# Patient Record
Sex: Male | Born: 1975 | Race: Black or African American | Hispanic: No | Smoking: Never smoker
Health system: Southern US, Community
[De-identification: ages and names within clinical notes are randomized; demographics above are authoritative.]

## PROBLEM LIST (undated history)

## (undated) DIAGNOSIS — N400 Enlarged prostate without lower urinary tract symptoms: Secondary | ICD-10-CM

## (undated) DIAGNOSIS — E785 Hyperlipidemia, unspecified: Secondary | ICD-10-CM

## (undated) DIAGNOSIS — T7840XA Allergy, unspecified, initial encounter: Secondary | ICD-10-CM

## (undated) HISTORY — PX: MOLE REMOVAL: SHX2046

## (undated) HISTORY — DX: Hyperlipidemia, unspecified: E78.5

## (undated) HISTORY — DX: Benign prostatic hyperplasia without lower urinary tract symptoms: N40.0

## (undated) HISTORY — DX: Allergy, unspecified, initial encounter: T78.40XA

---

## 2005-05-05 ENCOUNTER — Encounter: Admission: RE | Admit: 2005-05-05 | Discharge: 2005-05-05 | Payer: Self-pay | Admitting: Family Medicine

## 2013-12-19 ENCOUNTER — Emergency Department (HOSPITAL_COMMUNITY)
Admission: EM | Admit: 2013-12-19 | Discharge: 2013-12-19 | Disposition: A | Discharge status: Home / Self Care | Payer: Managed Care, Other (non HMO) | Attending: Emergency Medicine | Admitting: Emergency Medicine

## 2013-12-19 ENCOUNTER — Emergency Department (HOSPITAL_COMMUNITY): Payer: Managed Care, Other (non HMO)

## 2013-12-19 ENCOUNTER — Encounter (HOSPITAL_COMMUNITY): Payer: Self-pay | Admitting: Emergency Medicine

## 2013-12-19 DIAGNOSIS — Z87448 Personal history of other diseases of urinary system: Secondary | ICD-10-CM | POA: Insufficient documentation

## 2013-12-19 DIAGNOSIS — R109 Unspecified abdominal pain: Secondary | ICD-10-CM | POA: Insufficient documentation

## 2013-12-19 HISTORY — DX: Benign prostatic hyperplasia without lower urinary tract symptoms: N40.0

## 2013-12-19 LAB — COMPREHENSIVE METABOLIC PANEL
AST: 20 U/L (ref 0–37)
Alkaline Phosphatase: 48 U/L (ref 39–117)
Chloride: 100 mEq/L (ref 96–112)
Creatinine, Ser: 1.07 mg/dL (ref 0.50–1.35)
GFR calc Af Amer: >90 mL/min (ref >90)
GFR calc non Af Amer: 86 mL/min — ABNORMAL LOW (ref >90)
Potassium: 4.3 mEq/L (ref 3.5–5.1)
Sodium: 138 mEq/L (ref 135–145)

## 2013-12-19 LAB — CBC WITH DIFFERENTIAL/PLATELET
Basophils Relative: 0 % (ref 0–1)
Eosinophils Absolute: 0.1 10*3/uL (ref 0.0–0.7)
Eosinophils Relative: 1 % (ref 0–5)
Lymphocytes Relative: 32 % (ref 12–46)
MCH: 28.3 pg (ref 26.0–34.0)
MCV: 86.6 fL (ref 78.0–100.0)
Neutrophils Relative %: 59 % (ref 43–77)
Platelets: 175 10*3/uL (ref 150–400)
RBC: 5.09 MIL/uL (ref 4.22–5.81)
RDW: 12.4 % (ref 11.5–15.5)

## 2013-12-19 LAB — URINALYSIS, ROUTINE W REFLEX MICROSCOPIC
Bilirubin Urine: NEGATIVE
Glucose, UA: NEGATIVE mg/dL
Hgb urine dipstick: NEGATIVE
Specific Gravity, Urine: 1.01 (ref 1.005–1.030)
Urobilinogen, UA: 0.2 mg/dL (ref 0.0–1.0)

## 2013-12-19 LAB — LIPASE, BLOOD: Lipase: 40 U/L (ref 11–59)

## 2013-12-19 NOTE — ED Notes (Signed)
Pt has had some R sided flank/side pain. Pt was taking naprosyn for the pain which helped slightly. Denies any SOB, Cough, CP or abd pain. Pain increase with some movement.

## 2013-12-19 NOTE — ED Provider Notes (Signed)
CSN: [id number]     Arrival date & time 12/19/13  1045 History   First MD Initiated Contact with Patient 12/19/13 1107     Chief Complaint  Patient presents with  . Flank Pain    HPI  Charles Gibson is a 37 y.o. male with a PMH of enlarged prostate who presents to the ED for evaluation of flank pain.  History was provided by the patient.  Patient states that he has had flank pain for the past 6 days.  His pain is located on his right flank with some radiation to his middle back and around his RUQ.  His pain is described as a dull aching sensation.  His pain is worse with movement and is constant.  His pain has been unchanged for the past 6 days not improving or worsening.  He has taken antacids and Naprosyn with no relief.  He has never had similar abdominal pain in the past.  He denies any dysuria, hematuria, or difficulty urinating.  He denies any abdominal pain, emesis, nausea, diarrhea, constipation, change in appetite/activity, fevers, or chills.  He denies any injuries or trauma.  He denies any chest pain, SOB, cough, rhinorrhea, congestion, headache, dizziness or lightheadedness.  He does not have an established PCP but goes to an UC near his house for most of his cares.  He also has a urologist.     Past Medical History  Diagnosis Date  . Prostate enlargement    History reviewed. No pertinent past surgical history. History reviewed. No pertinent family history. History  Substance Use Topics  . Smoking status: Never Smoker   . Smokeless tobacco: Never Used  . Alcohol Use: No    Review of Systems  Constitutional: Negative for fever, chills, diaphoresis, activity change, appetite change and fatigue.  HENT: Negative for congestion, rhinorrhea and sore throat.   Respiratory: Negative for cough, shortness of breath and wheezing.   Cardiovascular: Negative for chest pain and leg swelling.  Gastrointestinal: Negative for nausea, vomiting, abdominal pain, diarrhea and constipation.   Genitourinary: Positive for flank pain. Negative for dysuria, frequency, penile swelling, difficulty urinating and penile pain.  Musculoskeletal: Negative for back pain, myalgias and neck pain.  Skin: Negative for wound.  Neurological: Negative for dizziness, syncope, weakness, light-headedness and headaches.    Allergies  Review of patient's allergies indicates no known allergies.  Home Medications  No current outpatient prescriptions on file. BP 125/82  Pulse 69  Temp(Src) 98.5 F (36.9 C) (Oral)  Resp 20  SpO2 99%  Filed Vitals:   12/19/13 1052  BP: 125/82  Pulse: 69  Temp: 98.5 F (36.9 C)  TempSrc: Oral  Resp: 20  SpO2: 99%    Physical Exam  Nursing note and vitals reviewed. Constitutional: He is oriented to person, place, and time. He appears well-developed and well-nourished. No distress.  HENT:  Head: Normocephalic and atraumatic.  Right Ear: External ear normal.  Left Ear: External ear normal.  Nose: Nose normal.  Mouth/Throat: Oropharynx is clear and moist. No oropharyngeal exudate.  Eyes: Conjunctivae are normal. Pupils are equal, round, and reactive to light. Right eye exhibits no discharge. Left eye exhibits no discharge.  Neck: Normal range of motion. Neck supple.  Cardiovascular: Normal rate, regular rhythm and normal heart sounds.  Exam reveals no gallop and no friction rub.   No murmur heard. Dorsalis pedis pulses present and equal bilaterally.  Pulmonary/Chest: Effort normal and breath sounds normal. No respiratory distress. He has no wheezes. He  has no rales. He exhibits no tenderness.  Abdominal: Soft. Bowel sounds are normal. He exhibits no distension and no mass. There is tenderness. There is no rebound and no guarding.  RUQ tenderness to palpation  Musculoskeletal: Normal range of motion. He exhibits tenderness. He exhibits no edema.       Arms: Tenderness to palpation to the right upper flank and right upper back. Pain worse with movement.  No  lumbar tenderness.  Patient able to ambulate without difficulty or ataxia.    Neurological: He is alert and oriented to person, place, and time.  Skin: Skin is warm and dry. He is not diaphoretic.  No rashes, erythema, edema, ecchymosis or lacerations to the back and chest    ED Course  Procedures (including critical care time) Labs Review Labs Reviewed - No data to display Imaging Review No results found.  EKG Interpretation   None      Results for orders placed during the hospital encounter of 12/19/13  URINALYSIS, ROUTINE W REFLEX MICROSCOPIC      Result Value Range   Color, Urine YELLOW  YELLOW   APPearance CLOUDY (*) CLEAR   Specific Gravity, Urine 1.010  1.005 - 1.030   pH 7.5  5.0 - 8.0   Glucose, UA NEGATIVE  NEGATIVE mg/dL   Hgb urine dipstick NEGATIVE  NEGATIVE   Bilirubin Urine NEGATIVE  NEGATIVE   Ketones, ur NEGATIVE  NEGATIVE mg/dL   Protein, ur NEGATIVE  NEGATIVE mg/dL   Urobilinogen, UA 0.2  0.0 - 1.0 mg/dL   Nitrite NEGATIVE  NEGATIVE   Leukocytes, UA NEGATIVE  NEGATIVE  CBC WITH DIFFERENTIAL      Result Value Range   WBC 5.1  4.0 - 10.5 K/uL   RBC 5.09  4.22 - 5.81 MIL/uL   Hemoglobin 14.4  13.0 - 17.0 g/dL   HCT 16.1  09.6 - 04.5 %   MCV 86.6  78.0 - 100.0 fL   MCH 28.3  26.0 - 34.0 pg   MCHC 32.7  30.0 - 36.0 g/dL   RDW 40.9  81.1 - 91.4 %   Platelets 175  150 - 400 K/uL   Neutrophils Relative % 59  43 - 77 %   Neutro Abs 3.0  1.7 - 7.7 K/uL   Lymphocytes Relative 32  12 - 46 %   Lymphs Abs 1.6  0.7 - 4.0 K/uL   Monocytes Relative 7  3 - 12 %   Monocytes Absolute 0.4  0.1 - 1.0 K/uL   Eosinophils Relative 1  0 - 5 %   Eosinophils Absolute 0.1  0.0 - 0.7 K/uL   Basophils Relative 0  0 - 1 %   Basophils Absolute 0.0  0.0 - 0.1 K/uL  LIPASE, BLOOD      Result Value Range   Lipase 40  11 - 59 U/L  COMPREHENSIVE METABOLIC PANEL      Result Value Range   Sodium 138  135 - 145 mEq/L   Potassium 4.3  3.5 - 5.1 mEq/L   Chloride 100  96 - 112  mEq/L   CO2 30  19 - 32 mEq/L   Glucose, Bld 96  70 - 99 mg/dL   BUN 11  6 - 23 mg/dL   Creatinine, Ser 7.82  0.50 - 1.35 mg/dL   Calcium 9.6  8.4 - 95.6 mg/dL   Total Protein 7.9  6.0 - 8.3 g/dL   Albumin 4.4  3.5 - 5.2 g/dL   AST  20  0 - 37 U/L   ALT 20  0 - 53 U/L   Alkaline Phosphatase 48  39 - 117 U/L   Total Bilirubin 0.7  0.3 - 1.2 mg/dL   GFR calc non Af Amer 86 (*) >90 mL/min   GFR calc Af Amer >90  >90 mL/min     US Abdomen Complete (Final result)  Result time: 12/19/13 14:54:08    Final result by Rad Results In Interface (12/19/13 14:54:08)    Narrative:   CLINICAL DATA: Right upper quadrant abdominal pain and tenderness.  EXAM: ULTRASOUND ABDOMEN COMPLETE  COMPARISON: None.  FINDINGS: Gallbladder:  Mild sludge in gallbladder and probable small polyp. No gallstones, wall thickening or pericholecystic fluid.  Common bile duct:  Diameter: 4.4 mm  Liver:  No focal lesion identified. Within normal limits in parenchymal echogenicity.  IVC:  No abnormality visualized.  Pancreas:  Visualized portion unremarkable.  Spleen:  Size and appearance within normal limits.  Right Kidney:  Length: 12.5 cm. Mildly echogenic. 1.5 cm upper pole cyst containing a mildly thickened internal septation. No hydronephrosis.  Left Kidney:  Length: 11.9 cm. Echogenicity within normal limits. No mass or hydronephrosis visualized.  Abdominal aorta:  No aneurysm visualized.  Other findings:  None.  IMPRESSION: 1. Mild sludge in the gallbladder and possible small gallbladder polyp. 2. No gallstones. 3. Mildly echogenic right kidney compatible with mild medical renal disease.   Electronically Signed By: Gordan Payment M.D. On: 12/19/2013 14:54    MDM   Charles Gibson is a 37 y.o. male with a PMH of enlarged prostate who presents to the ED for evaluation of flank pain.  Patient declined pain medications.    Rechecks  3:30 PM = Patient resting comfortably  watching TV.     Etiology of right flank pain unclear.  Patient had RUQ abdominal, flank, and right upper thoracic tenderness to palpation.  Abdominal US showed mild sludge in the GB with a small GB polyp as well as mild medical renal disease.  Unclear if this could cause the patient's abdominal pain.  Labs unremarkable.  Muscular causes of pain also possible.  Patient afebrile and non-toxic.  Pain well controlled without intervention.  Patient instructed to follow-up with his PCP for HIDA scan, urolgist (Dr. Orion Modest), and nephrology.  Return precautions, discharge instructions, and follow-up was discussed with the patient before discharge.     Discharge Medication List as of 12/19/2013  3:38 PM       Final impressions: 1. Right flank pain     Luiz Iron PA-C   This patient was discussed with Dr. Abbe Amsterdam, PA-C 12/21/13 2322

## 2013-12-19 NOTE — ED Notes (Signed)
US at bedside

## 2013-12-29 NOTE — ED Provider Notes (Signed)
Medical screening examination/treatment/procedure(s) were performed by non-physician practitioner and as supervising physician I was immediately available for consultation/collaboration.  EKG Interpretation   None       Temperence Zenor, MD, FACEP   Tineshia Becraft L Thetis Schwimmer, MD 12/29/13 1506 

## 2014-02-16 ENCOUNTER — Telehealth: Payer: Self-pay

## 2014-02-16 NOTE — Telephone Encounter (Signed)
Medication List and allergies:  Reviewed and updated  90 day supply/mail order: na Local prescriptions: [facility]  Immunizations due: declines flu vaccine; unsure of last Tdap  A/P:   Entered  FH, PSH and Personal Hx Flu vaccine--declines Tdap--due History of BPH since his 75's  To Discuss with Provider: Would like to have reviewed his results from ED abdominal US Found an issue with his left kidney

## 2014-02-19 ENCOUNTER — Encounter: Payer: Self-pay | Admitting: Family Medicine

## 2014-02-19 ENCOUNTER — Ambulatory Visit (INDEPENDENT_AMBULATORY_CARE_PROVIDER_SITE_OTHER): Payer: Managed Care, Other (non HMO) | Admitting: Family Medicine

## 2014-02-19 VITALS — BP 104/70 | HR 67 | Temp 98.2°F | Resp 16 | Ht 72.0 in | Wt 183.4 lb

## 2014-02-19 DIAGNOSIS — N281 Cyst of kidney, acquired: Secondary | ICD-10-CM

## 2014-02-19 DIAGNOSIS — Q619 Cystic kidney disease, unspecified: Secondary | ICD-10-CM

## 2014-02-19 DIAGNOSIS — J069 Acute upper respiratory infection, unspecified: Secondary | ICD-10-CM

## 2014-02-19 DIAGNOSIS — R1011 Right upper quadrant pain: Secondary | ICD-10-CM

## 2014-02-19 LAB — HEPATIC FUNCTION PANEL
ALK PHOS: 46 U/L (ref 39–117)
ALT: 19 U/L (ref 0–53)
AST: 19 U/L (ref 0–37)
Albumin: 4.5 g/dL (ref 3.5–5.2)
BILIRUBIN TOTAL: 0.7 mg/dL (ref 0.3–1.2)
Bilirubin, Direct: 0.1 mg/dL (ref 0.0–0.3)
TOTAL PROTEIN: 7.6 g/dL (ref 6.0–8.3)

## 2014-02-19 LAB — CBC WITH DIFFERENTIAL/PLATELET
BASOS PCT: 0.4 % (ref 0.0–3.0)
Basophils Absolute: 0 10*3/uL (ref 0.0–0.1)
EOS ABS: 0.2 10*3/uL (ref 0.0–0.7)
EOS PCT: 2.4 % (ref 0.0–5.0)
HCT: 46.1 % (ref 39.0–52.0)
Hemoglobin: 15 g/dL (ref 13.0–17.0)
Lymphocytes Relative: 20.3 % (ref 12.0–46.0)
Lymphs Abs: 1.3 10*3/uL (ref 0.7–4.0)
MCHC: 32.5 g/dL (ref 30.0–36.0)
MCV: 89.8 fl (ref 78.0–100.0)
MONO ABS: 0.6 10*3/uL (ref 0.1–1.0)
Monocytes Relative: 9.8 % (ref 3.0–12.0)
NEUTROS ABS: 4.3 10*3/uL (ref 1.4–7.7)
NEUTROS PCT: 67.1 % (ref 43.0–77.0)
Platelets: 194 10*3/uL (ref 150.0–400.0)
RBC: 5.14 Mil/uL (ref 4.22–5.81)
RDW: 13.1 % (ref 11.5–14.6)
WBC: 6.4 10*3/uL (ref 4.5–10.5)

## 2014-02-19 LAB — LIPID PANEL
Cholesterol: 202 mg/dL — ABNORMAL HIGH (ref 0–200)
HDL: 44.7 mg/dL (ref >39.00)
Total CHOL/HDL Ratio: 5
Triglycerides: 68 mg/dL (ref 0.0–149.0)
VLDL: 13.6 mg/dL (ref 0.0–40.0)

## 2014-02-19 LAB — BASIC METABOLIC PANEL
BUN: 11 mg/dL (ref 6–23)
CO2: 27 meq/L (ref 19–32)
Calcium: 9.6 mg/dL (ref 8.4–10.5)
Chloride: 104 mEq/L (ref 96–112)
Creatinine, Ser: 0.9 mg/dL (ref 0.4–1.5)
GFR: 120.4 mL/min (ref >60.00)
GLUCOSE: 89 mg/dL (ref 70–99)
Potassium: 4.3 mEq/L (ref 3.5–5.1)
SODIUM: 140 meq/L (ref 135–145)

## 2014-02-19 LAB — LDL CHOLESTEROL, DIRECT: LDL DIRECT: 149 mg/dL

## 2014-02-19 NOTE — Patient Instructions (Signed)
Schedule your complete physical for 6 months We'll notify you of your lab results and make any changes if needed Continue the low fat diet- this will improve the gallbladder symptoms Drink plenty of fluids, Vit C for oncoming cold If chest congestion and cough- Mucinex DM If head congestion- Dayquil/Nyquil Call with any questions or concerns Welcome!  We're glad to have you!

## 2014-02-19 NOTE — Progress Notes (Signed)
Pre visit review using our clinic review tool, if applicable. No additional management support is needed unless otherwise documented below in the visit note. 

## 2014-02-19 NOTE — Progress Notes (Signed)
   Subjective:    Patient ID: Charles Gibson, male    DOB: 09-Oct-1976, 38 y.o.   MRN: [id number]  HPI New to establish.  Previous MD- Cletus Gash (has seen UC in the interim).  Uro- [place]  abd pain- went to ER on 12/23 for RUQ abd pain.  No vomiting at the time.  Had US done showing gallbladder sludge but no obvious stones.  Possible polyp.  Since visit has changed diet- limiting fried food and fat intake.  No 'episodes' like the one in December.  Has had sporadic but much less intense discomfort since ER visit.  No known family hx of hyperlipidemia.  R renal cyst- noted on abd US done Dec 2014.  No family hx of renal disease.  No hx of HTN.  Review of Systems For ROS see HPI     Objective:   Physical Exam  Vitals reviewed. Constitutional: He is oriented to person, place, and time. He appears well-developed and well-nourished. No distress.  HENT:  Head: Normocephalic and atraumatic.  Eyes: Conjunctivae and EOM are normal. Pupils are equal, round, and reactive to light.  Neck: Normal range of motion. Neck supple. No thyromegaly present.  Cardiovascular: Normal rate, regular rhythm, normal heart sounds and intact distal pulses.   No murmur heard. Pulmonary/Chest: Effort normal and breath sounds normal. No respiratory distress.  Abdominal: Soft. Bowel sounds are normal. He exhibits no distension. There is no tenderness. There is no rebound.  Musculoskeletal: He exhibits no edema.  Lymphadenopathy:    He has no cervical adenopathy.  Neurological: He is alert and oriented to person, place, and time. No cranial nerve deficit.  Skin: Skin is warm and dry.  Psychiatric: He has a normal mood and affect. His behavior is normal.          Assessment & Plan:

## 2014-02-20 ENCOUNTER — Encounter: Payer: Self-pay | Admitting: General Practice

## 2014-02-20 NOTE — Assessment & Plan Note (Signed)
New.  No evidence of bacterial infxn.  Pt is in early stages of likely viral illness- 'i think i'm coming down w/ something'.  Reviewed supportive care and red flags that should prompt return.  Pt expressed understanding and is in agreement w/ plan.

## 2014-02-20 NOTE — Assessment & Plan Note (Signed)
New.  Pt dx'd w/ 'gallbladder sludge' on [place].  Has not had pain since changing to low fat diet.  Check lipids to assess for risk factors in stone formation.  Reviewed supportive care and red flags that should prompt return.  Pt expressed understanding and is in agreement w/ plan.

## 2014-02-20 NOTE — Assessment & Plan Note (Signed)
New.  Check BMP to assess Cr.  Will monitor renal fxn and plan on repeat renal US in 1 year to monitor for growth/stability.  Pt expressed understanding and is in agreement w/ plan.

## 2014-08-21 ENCOUNTER — Encounter: Payer: Self-pay | Admitting: Family Medicine

## 2014-08-21 ENCOUNTER — Ambulatory Visit (INDEPENDENT_AMBULATORY_CARE_PROVIDER_SITE_OTHER): Payer: Managed Care, Other (non HMO) | Admitting: Family Medicine

## 2014-08-21 VITALS — BP 118/78 | HR 67 | Temp 98.2°F | Resp 16 | Ht 72.5 in | Wt 186.5 lb

## 2014-08-21 DIAGNOSIS — E785 Hyperlipidemia, unspecified: Secondary | ICD-10-CM

## 2014-08-21 DIAGNOSIS — Z0000 Encounter for general adult medical examination without abnormal findings: Secondary | ICD-10-CM

## 2014-08-21 LAB — BASIC METABOLIC PANEL
BUN: 13 mg/dL (ref 6–23)
CALCIUM: 9.1 mg/dL (ref 8.4–10.5)
CHLORIDE: 103 meq/L (ref 96–112)
CO2: 28 meq/L (ref 19–32)
Creatinine, Ser: 1 mg/dL (ref 0.4–1.5)
GFR: 103.94 mL/min (ref >60.00)
GLUCOSE: 98 mg/dL (ref 70–99)
POTASSIUM: 4 meq/L (ref 3.5–5.1)
Sodium: 138 mEq/L (ref 135–145)

## 2014-08-21 LAB — LIPID PANEL
Cholesterol: 197 mg/dL (ref 0–200)
HDL: 42.5 mg/dL (ref >39.00)
LDL CALC: 131 mg/dL — AB (ref 0–99)
NonHDL: 154.5
Total CHOL/HDL Ratio: 5
Triglycerides: 117 mg/dL (ref 0.0–149.0)
VLDL: 23.4 mg/dL (ref 0.0–40.0)

## 2014-08-21 LAB — TSH: TSH: 1.4 u[IU]/mL (ref 0.35–4.50)

## 2014-08-21 LAB — HEPATIC FUNCTION PANEL
ALT: 18 U/L (ref 0–53)
AST: 21 U/L (ref 0–37)
Albumin: 4.3 g/dL (ref 3.5–5.2)
Alkaline Phosphatase: 39 U/L (ref 39–117)
BILIRUBIN TOTAL: 0.7 mg/dL (ref 0.2–1.2)
Bilirubin, Direct: 0 mg/dL (ref 0.0–0.3)
TOTAL PROTEIN: 7.4 g/dL (ref 6.0–8.3)

## 2014-08-21 LAB — CBC WITH DIFFERENTIAL/PLATELET
Basophils Absolute: 0 10*3/uL (ref 0.0–0.1)
Basophils Relative: 0.4 % (ref 0.0–3.0)
EOS PCT: 1.1 % (ref 0.0–5.0)
Eosinophils Absolute: 0.1 10*3/uL (ref 0.0–0.7)
HCT: 43 % (ref 39.0–52.0)
HEMOGLOBIN: 14.3 g/dL (ref 13.0–17.0)
Lymphocytes Relative: 31.4 % (ref 12.0–46.0)
Lymphs Abs: 1.5 10*3/uL (ref 0.7–4.0)
MCHC: 33.1 g/dL (ref 30.0–36.0)
MCV: 88.7 fl (ref 78.0–100.0)
MONO ABS: 0.4 10*3/uL (ref 0.1–1.0)
Monocytes Relative: 7.8 % (ref 3.0–12.0)
NEUTROS PCT: 59.3 % (ref 43.0–77.0)
Neutro Abs: 2.8 10*3/uL (ref 1.4–7.7)
PLATELETS: 198 10*3/uL (ref 150.0–400.0)
RBC: 4.85 Mil/uL (ref 4.22–5.81)
RDW: 12.4 % (ref 11.5–15.5)
WBC: 4.8 10*3/uL (ref 4.0–10.5)

## 2014-08-21 NOTE — Patient Instructions (Signed)
Schedule your complete physical in 1 year- sooner if you need me! We'll notify you of your lab results and make any changes if needed Keep up the good work on healthy diet and regular exercise Call with any questions or concerns Happy Labor Day!

## 2014-08-21 NOTE — Progress Notes (Signed)
Pre visit review using our clinic review tool, if applicable. No additional management support is needed unless otherwise documented below in the visit note. 

## 2014-08-21 NOTE — Progress Notes (Signed)
   Subjective:    Patient ID: Charles Gibson, male    DOB: February 19, 1976, 38 y.o.   MRN: [id number]  HPI CPE- no concerns.  Uro- seeing Dr Risa Grill regularly for BPH.   Review of Systems Patient reports no vision/hearing changes, anorexia, fever ,adenopathy, persistant/recurrent hoarseness, swallowing issues, chest pain, palpitations, edema, persistant/recurrent cough, hemoptysis, dyspnea (rest,exertional, paroxysmal nocturnal), gastrointestinal  bleeding (melena, rectal bleeding), abdominal pain, excessive heart burn, GU symptoms (dysuria, hematuria, voiding/incontinence issues) syncope, focal weakness, memory loss, numbness & tingling, skin/hair/nail changes, depression, anxiety, abnormal bruising/bleeding, musculoskeletal symptoms/signs.     Objective:   Physical Exam General Appearance:    Alert, cooperative, no distress, appears stated age  Head:    Normocephalic, without obvious abnormality, atraumatic  Eyes:    PERRL, conjunctiva/corneas clear, EOM's intact, fundi    benign, both eyes       Ears:    Normal TM's and external ear canals, both ears  Nose:   Nares normal, septum midline, mucosa normal, no drainage   or sinus tenderness  Throat:   Lips, mucosa, and tongue normal; teeth and gums normal  Neck:   Supple, symmetrical, trachea midline, no adenopathy;       thyroid:  No enlargement/tenderness/nodules  Back:     Symmetric, no curvature, ROM normal, no CVA tenderness  Lungs:     Clear to auscultation bilaterally, respirations unlabored  Chest wall:    No tenderness or deformity  Heart:    Regular rate and rhythm, S1 and S2 normal, no murmur, rub   or gallop  Abdomen:     Soft, non-tender, bowel sounds active all four quadrants,    no masses, no organomegaly  Genitalia:    Deferred to urology  Rectal:    Extremities:   Extremities normal, atraumatic, no cyanosis or edema  Pulses:   2+ and symmetric all extremities  Skin:   Skin color, texture, turgor normal, no rashes or lesions   Lymph nodes:   Cervical, supraclavicular, and axillary nodes normal  Neurologic:   CNII-XII intact. Normal strength, sensation and reflexes      throughout          Assessment & Plan:

## 2014-08-21 NOTE — Assessment & Plan Note (Signed)
Pt's PE WNL.  UTD on urology.  Check labs.  Anticipatory guidance provided.

## 2015-02-05 ENCOUNTER — Ambulatory Visit (INDEPENDENT_AMBULATORY_CARE_PROVIDER_SITE_OTHER): Payer: Managed Care, Other (non HMO) | Admitting: Family Medicine

## 2015-02-05 ENCOUNTER — Encounter: Payer: Self-pay | Admitting: Family Medicine

## 2015-02-05 VITALS — BP 120/80 | HR 78 | Temp 98.0°F | Resp 16 | Wt 187.2 lb

## 2015-02-05 DIAGNOSIS — L505 Cholinergic urticaria: Secondary | ICD-10-CM

## 2015-02-05 MED ORDER — CETIRIZINE HCL 10 MG PO TABS: 10.0000 mg | ORAL_TABLET | Freq: Every day | ORAL | Status: DC

## 2015-02-05 NOTE — Progress Notes (Signed)
   Subjective:    Patient ID: ERYC BODEY, male    DOB: 1976-07-20, 39 y.o.   MRN: [id number]  HPI Skin irritation- pt reports this occurs w/ exercise and elevated heart rate.  sxs started 2-3 yrs ago.  Pt reports diffuse itching but no obvious rash.  'it's like a histamine thing'.  Has not tried OTC antihistamine.  No rash or itching any other times and itching completely resolves by time pt leaves gym.  Denies SOB, facial swelling, difficulty w/ airway.   Review of Systems For ROS see HPI   Reviewed meds, allergies, problem list, and PMH in chart     Objective:   Physical Exam  Constitutional: He is oriented to person, place, and time. He appears well-developed and well-nourished. No distress.  HENT:  Head: Normocephalic and atraumatic.  Cardiovascular: Normal rate, regular rhythm and normal heart sounds.   Pulmonary/Chest: Effort normal and breath sounds normal. No respiratory distress. He has no wheezes. He has no rales.  Musculoskeletal: He exhibits no edema.  Neurological: He is alert and oriented to person, place, and time.  Skin: Skin is warm and dry. No rash noted. No erythema.  Psychiatric: He has a normal mood and affect. His behavior is normal. Thought content normal.  Vitals reviewed.         Assessment & Plan:

## 2015-02-05 NOTE — Assessment & Plan Note (Signed)
Pt's sxs consistent w/ cholinergic urticaria.  No report of airway or facial edema.  Start OTC antihistamine daily for prevention.  Discussed trigger avoidance.  Reviewed supportive care and red flags that should prompt return.  Pt expressed understanding and is in agreement w/ plan.

## 2015-02-05 NOTE — Patient Instructions (Signed)
Follow up as needed This is called cholinergic urticaria and is caused by raising your core temperature Start Zyrtec daily- ask the pharmacy which is cheaper (prescription or OTC) If symptoms continue, please call me so we can increase the dose Call with any questions or concerns Hang in there!

## 2015-02-05 NOTE — Progress Notes (Signed)
Pre visit review using our clinic review tool, if applicable. No additional management support is needed unless otherwise documented below in the visit note. 

## 2015-04-25 ENCOUNTER — Ambulatory Visit (INDEPENDENT_AMBULATORY_CARE_PROVIDER_SITE_OTHER): Payer: Managed Care, Other (non HMO) | Admitting: Family Medicine

## 2015-04-25 ENCOUNTER — Encounter: Payer: Self-pay | Admitting: Family Medicine

## 2015-04-25 VITALS — BP 124/80 | HR 79 | Temp 98.0°F | Resp 16 | Wt 191.0 lb

## 2015-04-25 DIAGNOSIS — M25562 Pain in left knee: Secondary | ICD-10-CM | POA: Diagnosis not present

## 2015-04-25 MED ORDER — MELOXICAM 15 MG PO TABS: 15.0000 mg | ORAL_TABLET | Freq: Every day | ORAL | Status: DC

## 2015-04-25 NOTE — Progress Notes (Signed)
   Subjective:    Patient ID: Charles Gibson, male    DOB: 1976/07/12, 39 y.o.   MRN: [id number]  HPI Knee pain- L knee pain, no known injury or change in activity level.  sxs started ~2 weeks ago.  Pain is a constant 2-3, 'throbbing' will increase to 4-5 w/ activity.  No swelling.  Pt has audible clicking w/ extension.   Review of Systems For ROS see HPI     Objective:   Physical Exam  Constitutional: He is oriented to person, place, and time. He appears well-developed and well-nourished. No distress.  Cardiovascular: Intact distal pulses.   Musculoskeletal: He exhibits tenderness (over distal lateral L quad w/ palpable and audible popping of quad tendon w/ pain). He exhibits no edema.  Full flexion/extension of L knee  Neurological: He is alert and oriented to person, place, and time. He has normal reflexes. No cranial nerve deficit. Coordination normal.  Vitals reviewed.         Assessment & Plan:

## 2015-04-25 NOTE — Assessment & Plan Note (Signed)
New.  No known injury.  No edema.  + popping and clicking of L quad tendon w/ associated pain.  Full ROM of L knee, no edema.  Start daily Mobic and refer to sports medicine for complete evaluation and tx.  Pt expressed understanding and is in agreement w/ plan.

## 2015-04-25 NOTE — Progress Notes (Signed)
Pre visit review using our clinic review tool, if applicable. No additional management support is needed unless otherwise documented below in the visit note. 

## 2015-04-25 NOTE — Patient Instructions (Signed)
Schedule your complete physical for after 8/25 Start the Mobic once daily- take w/ food We'll call you with your sports med appt Call with any questions or concerns Hang in there!

## 2015-05-01 ENCOUNTER — Ambulatory Visit (INDEPENDENT_AMBULATORY_CARE_PROVIDER_SITE_OTHER): Payer: Managed Care, Other (non HMO) | Admitting: Family Medicine

## 2015-05-01 ENCOUNTER — Encounter: Payer: Self-pay | Admitting: Family Medicine

## 2015-05-01 VITALS — BP 127/77 | HR 66 | Ht 71.0 in | Wt 190.0 lb

## 2015-05-01 DIAGNOSIS — M25562 Pain in left knee: Secondary | ICD-10-CM

## 2015-05-01 NOTE — Patient Instructions (Signed)
You have patellofemoral syndrome with quad tendonitis. Avoid painful activities (especially squats and lunges, plyometrics, increasing running mileage) when possible. Straight leg raise, straight leg raise with foot turned outwards, side hip raises 3 sets of 10 once a day. Add ankle weight if these become too easy. Insoles with better arch support (something like dr. Zoe Lan active series for example). Avoid flat shoes, barefoot walking when possible. Icing or heat 15 minutes at a time 3-4 times a day as needed. Tylenol or aleve only as needed for pain If not improving would consider physical therapy. Follow up with me in 6 weeks.

## 2015-05-06 NOTE — Progress Notes (Signed)
PCP and referred by: Annye Asa, MD  Subjective:   HPI: Patient is a 39 y.o. male here for left knee pain.  Patient denies known injury or trauma. He states for especially the past 1-2 months left anterior knee has been bothering him at 2/10 level, up to 5/10 especially with bending. No swelling or bruising. Remotely this bothered him as well but went away. No catching, locking, giving out.  Past Medical History  Diagnosis Date  . Prostate enlargement   . BPH (benign prostatic hyperplasia)     since 20's    Current Outpatient Prescriptions on File Prior to Visit  Medication Sig Dispense Refill  . cetirizine (ZYRTEC) 10 MG tablet Take 1 tablet (10 mg total) by mouth daily. 90 tablet 3  . CIALIS 5 MG tablet Take 5 mg by mouth daily.   6  . meloxicam (MOBIC) 15 MG tablet Take 1 tablet (15 mg total) by mouth daily. 30 tablet 1   No current facility-administered medications on file prior to visit.    Past Surgical History  Procedure Laterality Date  . Mole removal      Allergies  Allergen Reactions  . Adhesive [Tape] Rash    History   Social History  . Marital Status: Married    Spouse Name: N/A  . Number of Children: N/A  . Years of Education: N/A   Occupational History  . Not on file.   Social History Main Topics  . Smoking status: Never Smoker   . Smokeless tobacco: Never Used  . Alcohol Use: No  . Drug Use: No  . Sexual Activity: No   Other Topics Concern  . Not on file   Social History Narrative    Family History  Problem Relation Age of Onset  . Aortic aneurysm Father     BP 127/77 mmHg  Pulse 66  Ht 5\' 11"  (1.803 m)  Wt 190 lb (86.183 kg)  BMI 26.51 kg/m2  Review of Systems: See HPI above.    Objective:  Physical Exam:  Gen: NAD  Left knee: Mild VMO atrophy.  No gross deformity, ecchymoses, swelling. Pes planus. TTP post patellar facets, quad tendon at insertion.  No joint line, other tenderness. FROM. Negative ant/post  drawers. Negative valgus/varus testing. Negative lachmanns. Negative mcmurrays, apleys, patellar apprehension. NV intact distally.    Assessment & Plan:  1. Left knee pain - 2/2 patellofemoral syndrome and mild quad tendonitis.  Shown home exercises to do daily, encouraged better arch supports for overpronation as well.  Icing or heat, tylenol/nsaids if needed.  Consider PT if not improving.  F/u in 6 weeks.

## 2015-05-06 NOTE — Assessment & Plan Note (Signed)
2/2 patellofemoral syndrome and mild quad tendonitis.  Shown home exercises to do daily, encouraged better arch supports for overpronation as well.  Icing or heat, tylenol/nsaids if needed.  Consider PT if not improving.  F/u in 6 weeks.

## 2015-05-31 ENCOUNTER — Encounter: Payer: Self-pay | Admitting: Family Medicine

## 2015-05-31 ENCOUNTER — Ambulatory Visit (INDEPENDENT_AMBULATORY_CARE_PROVIDER_SITE_OTHER): Payer: Managed Care, Other (non HMO) | Admitting: Family Medicine

## 2015-05-31 VITALS — BP 124/82 | HR 79 | Temp 97.9°F | Resp 16 | Wt 194.1 lb

## 2015-05-31 DIAGNOSIS — J3089 Other allergic rhinitis: Secondary | ICD-10-CM

## 2015-05-31 DIAGNOSIS — J309 Allergic rhinitis, unspecified: Secondary | ICD-10-CM | POA: Insufficient documentation

## 2015-05-31 MED ORDER — FLUTICASONE PROPIONATE 50 MCG/ACT NA SUSP: 2.0000 | Freq: Every day | NASAL | Status: DC

## 2015-05-31 NOTE — Patient Instructions (Signed)
Follow up as needed Continue the Zyrtec daily Drink plenty of fluids Add the nasal steroid spray- 2 sprays each nostril Mucinex as needed to thin congestion REST! Call with any questions or concerns- especially if not improving Have a great weekend! Hang in there!

## 2015-05-31 NOTE — Progress Notes (Signed)
   Subjective:    Patient ID: Charles Gibson, male    DOB: 1976/01/23, 39 y.o.   MRN: [id number]  HPI URI- sxs started 2 weeks ago w/ nasal congestion, drainage.  This improved but sxs returned ~5 days ago w/ sinus pressure and congestion.  No tooth pain.  No ear pain.  No fevers.  No HA.  Minimal cough.  No sore throat.  Taking OTC Airborne.   Review of Systems For ROS see HPI     Objective:   Physical Exam  Constitutional: He appears well-developed and well-nourished. No distress.  HENT:  Head: Normocephalic and atraumatic.  No TTP over sinuses + turbinate edema + PND TMs normal bilaterally  Eyes: Conjunctivae and EOM are normal. Pupils are equal, round, and reactive to light.  Neck: Normal range of motion. Neck supple.  Cardiovascular: Normal rate, regular rhythm and normal heart sounds.   Pulmonary/Chest: Effort normal and breath sounds normal. No respiratory distress. He has no wheezes.  Lymphadenopathy:    He has no cervical adenopathy.  Skin: Skin is warm and dry.          Assessment & Plan:

## 2015-05-31 NOTE — Progress Notes (Signed)
Pre visit review using our clinic review tool, if applicable. No additional management support is needed unless otherwise documented below in the visit note. 

## 2015-06-02 NOTE — Assessment & Plan Note (Signed)
New.  Pt's sxs are consistent w/ undertreated seasonal allergies.  No evidence of infxn.  Add nasal steroid to pt's daily OTC antihistamine.  Reviewed supportive care and red flags that should prompt return.  Pt expressed understanding and is in agreement w/ plan.

## 2015-06-12 ENCOUNTER — Ambulatory Visit: Payer: Managed Care, Other (non HMO) | Admitting: Family Medicine

## 2015-10-03 ENCOUNTER — Telehealth: Payer: Self-pay

## 2015-10-03 NOTE — Telephone Encounter (Signed)
Pre Visit call completed. 

## 2015-10-04 ENCOUNTER — Ambulatory Visit (INDEPENDENT_AMBULATORY_CARE_PROVIDER_SITE_OTHER): Payer: Managed Care, Other (non HMO) | Admitting: Family Medicine

## 2015-10-04 ENCOUNTER — Encounter: Payer: Self-pay | Admitting: Family Medicine

## 2015-10-04 VITALS — BP 110/70 | HR 68 | Temp 98.0°F | Resp 16 | Ht 71.0 in | Wt 199.0 lb

## 2015-10-04 DIAGNOSIS — Z0000 Encounter for general adult medical examination without abnormal findings: Secondary | ICD-10-CM | POA: Diagnosis not present

## 2015-10-04 LAB — LIPID PANEL
CHOL/HDL RATIO: 5
Cholesterol: 226 mg/dL — ABNORMAL HIGH (ref 0–200)
HDL: 45.1 mg/dL (ref >39.00)
LDL Cholesterol: 162 mg/dL — ABNORMAL HIGH (ref 0–99)
NONHDL: 180.53
Triglycerides: 91 mg/dL (ref 0.0–149.0)
VLDL: 18.2 mg/dL (ref 0.0–40.0)

## 2015-10-04 LAB — CBC WITH DIFFERENTIAL/PLATELET
BASOS PCT: 0.5 % (ref 0.0–3.0)
Basophils Absolute: 0 10*3/uL (ref 0.0–0.1)
EOS PCT: 0.9 % (ref 0.0–5.0)
Eosinophils Absolute: 0.1 10*3/uL (ref 0.0–0.7)
HEMATOCRIT: 45.9 % (ref 39.0–52.0)
HEMOGLOBIN: 15.4 g/dL (ref 13.0–17.0)
Lymphocytes Relative: 31.1 % (ref 12.0–46.0)
Lymphs Abs: 1.7 10*3/uL (ref 0.7–4.0)
MCHC: 33.5 g/dL (ref 30.0–36.0)
MCV: 87 fl (ref 78.0–100.0)
MONO ABS: 0.5 10*3/uL (ref 0.1–1.0)
MONOS PCT: 9.3 % (ref 3.0–12.0)
Neutro Abs: 3.3 10*3/uL (ref 1.4–7.7)
Neutrophils Relative %: 58.2 % (ref 43.0–77.0)
Platelets: 223 10*3/uL (ref 150.0–400.0)
RBC: 5.28 Mil/uL (ref 4.22–5.81)
RDW: 12.7 % (ref 11.5–15.5)
WBC: 5.6 10*3/uL (ref 4.0–10.5)

## 2015-10-04 LAB — BASIC METABOLIC PANEL
BUN: 13 mg/dL (ref 6–23)
CHLORIDE: 103 meq/L (ref 96–112)
CO2: 29 mEq/L (ref 19–32)
CREATININE: 1.05 mg/dL (ref 0.40–1.50)
Calcium: 9.5 mg/dL (ref 8.4–10.5)
GFR: 99.96 mL/min (ref >60.00)
Glucose, Bld: 98 mg/dL (ref 70–99)
POTASSIUM: 4.1 meq/L (ref 3.5–5.1)
Sodium: 139 mEq/L (ref 135–145)

## 2015-10-04 LAB — HEPATIC FUNCTION PANEL
ALT: 17 U/L (ref 0–53)
AST: 14 U/L (ref 0–37)
Albumin: 4.6 g/dL (ref 3.5–5.2)
Alkaline Phosphatase: 46 U/L (ref 39–117)
BILIRUBIN TOTAL: 0.9 mg/dL (ref 0.2–1.2)
Bilirubin, Direct: 0.1 mg/dL (ref 0.0–0.3)
Total Protein: 7.9 g/dL (ref 6.0–8.3)

## 2015-10-04 LAB — TSH: TSH: 1.88 u[IU]/mL (ref 0.35–4.50)

## 2015-10-04 NOTE — Assessment & Plan Note (Signed)
Pt's PE WNL.  Declines flu shot.  Check labs.  Anticipatory guidance provided.

## 2015-10-04 NOTE — Progress Notes (Signed)
Pre visit review using our clinic review tool, if applicable. No additional management support is needed unless otherwise documented below in the visit note. 

## 2015-10-04 NOTE — Progress Notes (Signed)
   Subjective:    Patient ID: DOMINICK MORELLA, male    DOB: 1976/04/02, 39 y.o.   MRN: [id number]  HPI CPE- no concerns today.  Declines flu shot.  Pt lost twin girls in August when they were born prematurely.  Seeing a Social worker.   Review of Systems Patient reports no vision/hearing changes, anorexia, fever ,adenopathy, persistant/recurrent hoarseness, swallowing issues, chest pain, palpitations, edema, persistant/recurrent cough, hemoptysis, dyspnea (rest,exertional, paroxysmal nocturnal), gastrointestinal  bleeding (melena, rectal bleeding), abdominal pain, excessive heart burn, GU symptoms (dysuria, hematuria, voiding/incontinence issues) syncope, focal weakness, memory loss, numbness & tingling, skin/hair/nail changes, depression, anxiety, abnormal bruising/bleeding, musculoskeletal symptoms/signs.     Objective:   Physical Exam General Appearance:    Alert, cooperative, no distress, appears stated age  Head:    Normocephalic, without obvious abnormality, atraumatic  Eyes:    PERRL, conjunctiva/corneas clear, EOM's intact, fundi    benign, both eyes       Ears:    Normal TM's and external ear canals, both ears  Nose:   Nares normal, septum midline, mucosa normal, no drainage   or sinus tenderness  Throat:   Lips, mucosa, and tongue normal; teeth and gums normal  Neck:   Supple, symmetrical, trachea midline, no adenopathy;       thyroid:  No enlargement/tenderness/nodules  Back:     Symmetric, no curvature, ROM normal, no CVA tenderness  Lungs:     Clear to auscultation bilaterally, respirations unlabored  Chest wall:    No tenderness or deformity  Heart:    Regular rate and rhythm, S1 and S2 normal, no murmur, rub   or gallop  Abdomen:     Soft, non-tender, bowel sounds active all four quadrants,    no masses, no organomegaly  Genitalia:    Normal male without lesion, masses,discharge or tenderness  Rectal:    Deferred due to young age  Extremities:   Extremities normal,  atraumatic, no cyanosis or edema  Pulses:   2+ and symmetric all extremities  Skin:   Skin color, texture, turgor normal, no rashes or lesions  Lymph nodes:   Cervical, supraclavicular, and axillary nodes normal  Neurologic:   CNII-XII intact. Normal strength, sensation and reflexes      throughout          Assessment & Plan:

## 2015-10-04 NOTE — Patient Instructions (Signed)
Follow up in 1 year or as needed We'll notify you of your lab results and make any changes if needed Keep up the good work on healthy diet and regular exercise- you look great! Call with any questions or concerns If you want to join [place] at the new [place] office, any scheduled appointments will automatically transfer and we will see you at [address] [place] [address] [place], [place], [place] [address]  Hang in there! Happy Fall!!!

## 2015-10-08 ENCOUNTER — Other Ambulatory Visit: Payer: Self-pay | Admitting: General Practice

## 2015-10-08 DIAGNOSIS — E785 Hyperlipidemia, unspecified: Secondary | ICD-10-CM

## 2015-10-08 MED ORDER — ATORVASTATIN CALCIUM 10 MG PO TABS: 10.0000 mg | ORAL_TABLET | Freq: Every day | ORAL | Status: DC

## 2016-02-21 ENCOUNTER — Other Ambulatory Visit: Payer: Self-pay | Admitting: Family Medicine

## 2016-02-21 NOTE — Telephone Encounter (Signed)
Medication filled to pharmacy as requested.   

## 2016-03-19 ENCOUNTER — Ambulatory Visit (INDEPENDENT_AMBULATORY_CARE_PROVIDER_SITE_OTHER): Payer: Managed Care, Other (non HMO) | Admitting: Family Medicine

## 2016-03-19 ENCOUNTER — Encounter: Payer: Self-pay | Admitting: Family Medicine

## 2016-03-19 VITALS — BP 128/80 | HR 114 | Temp 100.1°F | Resp 17 | Wt 192.1 lb

## 2016-03-19 DIAGNOSIS — J111 Influenza due to unidentified influenza virus with other respiratory manifestations: Secondary | ICD-10-CM | POA: Diagnosis not present

## 2016-03-19 DIAGNOSIS — R69 Illness, unspecified: Principal | ICD-10-CM

## 2016-03-19 MED ORDER — OSELTAMIVIR PHOSPHATE 75 MG PO CAPS: 75.0000 mg | ORAL_CAPSULE | Freq: Two times a day (BID) | ORAL | Status: DC

## 2016-03-19 MED ORDER — PROMETHAZINE-DM 6.25-15 MG/5ML PO SYRP: 5.0000 mL | ORAL_SOLUTION | Freq: Four times a day (QID) | ORAL | Status: DC | PRN

## 2016-03-19 NOTE — Progress Notes (Signed)
   Subjective:    Patient ID: Charles Gibson, male    DOB: 26-Jul-1976, 40 y.o.   MRN: [id number]  HPI URI- sxs started w/ sore throat on Monday.  Tuesday developed chills and subjective fever.  Stayed home from work yesterday.  Mild HA- 'occasional throbbing'.  + chest congestion.  Denies nasal congestion.  Intermittent productive cough x24 hrs.  Taking Robitussin DM and tylenol for fever.  No sinus pain/pressure.  No ear pain.  Mild body aches.   Review of Systems For ROS see HPI     Objective:   Physical Exam  Constitutional: He is oriented to person, place, and time. He appears well-developed and well-nourished. No distress.  HENT:  Head: Normocephalic and atraumatic.  Right Ear: Tympanic membrane normal.  Left Ear: Tympanic membrane normal.  Nose: Right sinus exhibits no maxillary sinus tenderness and no frontal sinus tenderness. Left sinus exhibits no maxillary sinus tenderness and no frontal sinus tenderness.  Mouth/Throat: Oropharynx is clear and moist. No oropharyngeal exudate.  Neck: Normal range of motion. Neck supple.  Cardiovascular: Normal rate, regular rhythm and normal heart sounds.   Pulmonary/Chest: Effort normal and breath sounds normal. No respiratory distress. He has no wheezes. He has no rales.  Lymphadenopathy:    He has no cervical adenopathy.  Neurological: He is alert and oriented to person, place, and time.  Skin: Skin is warm. No rash noted.  Vitals reviewed.         Assessment & Plan:

## 2016-03-19 NOTE — Progress Notes (Signed)
Pre visit review using our clinic review tool, if applicable. No additional management support is needed unless otherwise documented below in the visit note. 

## 2016-03-19 NOTE — Patient Instructions (Signed)
Follow up as needed Your flu test is negative but this is still suspicious for flu Start the Tamiflu twice daily Drink plenty of fluids REST! Use the cough syrup for nights/weekends- will cause drowsiness Mucinex DM or Robitussin DM or Delsym for daytime cough Alternate tylenol and ibuprofen every 4 hrs as needed for fever or body aches Call with any questions or concerns Hang in there!!!

## 2016-03-19 NOTE — Assessment & Plan Note (Signed)
New.  Pt's fever, chills, body aches and lack of bacterial illness on PE consistent w/ influenza like illness.  Despite negative flu test will start Tamiflu twice daily and cough meds prn.  Reviewed supportive care and red flags that should prompt return.  Pt expressed understanding and is in agreement w/ plan.

## 2016-03-20 LAB — POCT INFLUENZA A/B
Influenza A, POC: NEGATIVE
Influenza B, POC: NEGATIVE

## 2016-03-20 NOTE — Addendum Note (Signed)
Addended by: Davis Gourd on: 03/20/2016 07:48 AM   Modules accepted: Orders

## 2016-08-07 ENCOUNTER — Ambulatory Visit (INDEPENDENT_AMBULATORY_CARE_PROVIDER_SITE_OTHER): Payer: Managed Care, Other (non HMO) | Admitting: Family Medicine

## 2016-08-07 ENCOUNTER — Encounter: Payer: Self-pay | Admitting: Family Medicine

## 2016-08-07 VITALS — BP 122/84 | HR 71 | Temp 98.1°F | Resp 17 | Ht 71.0 in | Wt 191.5 lb

## 2016-08-07 DIAGNOSIS — H60543 Acute eczematoid otitis externa, bilateral: Secondary | ICD-10-CM | POA: Diagnosis not present

## 2016-08-07 MED ORDER — TRIAMCINOLONE ACETONIDE 0.1 % EX OINT: 1.0000 "application " | TOPICAL_OINTMENT | Freq: Two times a day (BID) | CUTANEOUS | 1 refills | Status: DC

## 2016-08-07 NOTE — Progress Notes (Signed)
   Subjective:    Patient ID: WARING WOOLARD, male    DOB: 05-23-1976, 40 y.o.   MRN: [id number]  HPI Rash- behind R ear.  First started as dry skin ~1 month ago.  Started moisturizing w/ lotion and developed increased irritation.  Now behind both ears.  Is waking scratching at night.     Review of Systems For ROS see HPI     Objective:   Physical Exam  Constitutional: He is oriented to person, place, and time. He appears well-developed and well-nourished. No distress.  HENT:  Head: Normocephalic and atraumatic.  Right Ear: External ear normal.  Left Ear: External ear normal.  Neurological: He is alert and oriented to person, place, and time.  Skin: Skin is warm and dry. Rash (dry hyperpigmented skin posterior to ears bilaterally consistent w/ eczema) noted.  Psychiatric: He has a normal mood and affect. His behavior is normal. Thought content normal.  Vitals reviewed.         Assessment & Plan:  Eczema- new.  Dry, hyperpigmented skin posterior to ears bilaterally.  Start Triamcinolone prn.  Reviewed supportive care and red flags that should prompt return.  Pt expressed understanding and is in agreement w/ plan.

## 2016-08-07 NOTE — Progress Notes (Signed)
Pre visit review using our clinic review tool, if applicable. No additional management support is needed unless otherwise documented below in the visit note. 

## 2016-08-07 NOTE — Patient Instructions (Signed)
Follow up as scheduled This appears to be eczema Use the Triamcinolone ointment- thin layer, up to twice daily as needed for flares Call with any questions or concerns Enjoy the rest of your summer!!!

## 2016-08-19 ENCOUNTER — Encounter: Payer: Self-pay | Admitting: Family Medicine

## 2016-08-19 ENCOUNTER — Ambulatory Visit (INDEPENDENT_AMBULATORY_CARE_PROVIDER_SITE_OTHER): Payer: Managed Care, Other (non HMO) | Admitting: Family Medicine

## 2016-08-19 DIAGNOSIS — M2550 Pain in unspecified joint: Secondary | ICD-10-CM | POA: Diagnosis not present

## 2016-08-19 MED ORDER — CYCLOBENZAPRINE HCL 10 MG PO TABS: 10.0000 mg | ORAL_TABLET | Freq: Three times a day (TID) | ORAL | 0 refills | Status: DC | PRN

## 2016-08-19 MED ORDER — MELOXICAM 15 MG PO TABS: 15.0000 mg | ORAL_TABLET | Freq: Every day | ORAL | 1 refills | Status: DC

## 2016-08-19 NOTE — Progress Notes (Signed)
Pre visit review using our clinic review tool, if applicable. No additional management support is needed unless otherwise documented below in the visit note. 

## 2016-08-19 NOTE — Patient Instructions (Signed)
Follow up as needed Start the Meloxicam once daily- take w/ food.  This is for inflammation.  Do not take any additional ibuprofen/aleve/motrin/etc but you can add tylenol Use the Flexeril nightly for muscle spasm.  You can use this during the day but it will cause drowsiness Heat the back and neck, ice the knee and ankle!!! If no improvement in the next 7-10 days, please let me know and we'll send you to ortho Hang in there!!!
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
## 2016-08-29 ENCOUNTER — Other Ambulatory Visit: Payer: Self-pay | Admitting: Family Medicine

## 2016-09-24 ENCOUNTER — Encounter: Payer: Self-pay | Admitting: Family Medicine

## 2016-09-24 ENCOUNTER — Ambulatory Visit (INDEPENDENT_AMBULATORY_CARE_PROVIDER_SITE_OTHER): Payer: Managed Care, Other (non HMO) | Admitting: Family Medicine

## 2016-09-24 VITALS — BP 108/80 | HR 80 | Temp 98.1°F | Ht 71.0 in | Wt 190.2 lb

## 2016-09-24 DIAGNOSIS — J020 Streptococcal pharyngitis: Secondary | ICD-10-CM

## 2016-09-24 LAB — POCT RAPID STREP A (OFFICE): RAPID STREP A SCREEN: POSITIVE — AB

## 2016-09-24 MED ORDER — AMOXICILLIN 500 MG PO CAPS: 500.0000 mg | ORAL_CAPSULE | Freq: Three times a day (TID) | ORAL | 0 refills | Status: DC

## 2016-09-24 NOTE — Progress Notes (Signed)
Pre visit review using our clinic review tool, if applicable. No additional management support is needed unless otherwise documented below in the visit note. 

## 2016-09-24 NOTE — Progress Notes (Signed)
SUBJECTIVE:   Charles Gibson is a 40 y.o. male presents to the clinic for:  Chief Complaint  Patient presents with  . Sore Throat    with night sweats,cough(product-green)-sxs on yest    Complains of sore throat for 1 day.  Other associated symptoms: cough, nightsweats.  Denies: sinus congestion, rhinorrhea, itchy watery eyes, ear pain, ear drainage, shortness of breath and fevers Sick Contacts: none known Therapy to date: none  Son is in the NICU and is concerned he may be contagious.  History  Smoking Status  . Never Smoker  Smokeless Tobacco  . Never Used    ROS: Pertinent items are noted in HPI  Patient's medications, allergies, past medical, surgical, social and family histories were reviewed and updated as appropriate.  OBJECTIVE:  BP 108/80 (BP Location: Left Arm, Patient Position: Sitting, Cuff Size: Normal)   Pulse 80   Temp 98.1 F (36.7 C) (Oral)   Ht 5\' 11"  (1.803 m)   Wt 190 lb 3.2 oz (86.3 kg)   SpO2 98%   BMI 26.53 kg/m  General: Awake, alert, appearing stated age Eyes: conjunctivae and sclerae clear Ears: normal TMs bilaterally Nose: no visible exudate Oropharynx: mild erythema. No exudates. MMM Neck: supple, no significant adenopathy Lungs: clear to auscultation, no wheezes, rales or rhonchi, symmetric air entry, normal effort Heart: rate and rhythm regular Skin:reveals no rash Psych: Age appropriate judgment and insight  ASSESSMENT/PLAN:  Strep throat - Plan: POCT rapid strep A, amoxicillin (AMOXIL) 500 MG capsule  Orders as above. Rapid strep is positive. Contagious for the next 24 hours after starting antibiotics. Get a new toothbrush after 24 hours on antibiotics. F/u prn. Pt voiced understanding and agreement to the plan.  [place], DO 09/24/16 4:35 PM

## 2016-09-24 NOTE — Patient Instructions (Addendum)
New toothbrush after 24 hrs on antibiotic. Consider yourself contagious during these first 24 hours as well.  Strep Throat Strep throat is a bacterial infection of the throat. Your health care provider may call the infection tonsillitis or pharyngitis, depending on whether there is swelling in the tonsils or at the back of the throat. Strep throat is most common during the cold months of the year in children who are 42-42 years of age, but it can happen during any season in people of any age. This infection is spread from person to person (contagious) through coughing, sneezing, or close contact. CAUSES Strep throat is caused by the bacteria called Streptococcus pyogenes. RISK FACTORS This condition is more likely to develop in:  People who spend time in crowded places where the infection can spread easily.  People who have close contact with someone who has strep throat. SYMPTOMS Symptoms of this condition include:  Fever or chills.   Redness, swelling, or pain in the tonsils or throat.  Pain or difficulty when swallowing.  White or yellow spots on the tonsils or throat.  Swollen, tender glands in the neck or under the jaw.  Red rash all over the body (rare). DIAGNOSIS This condition is diagnosed by performing a rapid strep test or by taking a swab of your throat (throat culture test). Results from a rapid strep test are usually ready in a few minutes, but throat culture test results are available after one or two days. TREATMENT This condition is treated with antibiotic medicine. HOME CARE INSTRUCTIONS Medicines  Take over-the-counter and prescription medicines only as told by your health care provider.  Take your antibiotic as told by your health care provider. Do not stop taking the antibiotic even if you start to feel better.  Have family members who also have a sore throat or fever tested for strep throat. They may need antibiotics if they have the strep infection. Eating  and Drinking  Do not share food, drinking cups, or personal items that could cause the infection to spread to other people.  If swallowing is difficult, try eating soft foods until your sore throat feels better.  Drink enough fluid to keep your urine clear or pale yellow. General Instructions  Gargle with a salt-water mixture 3-4 times per day or as needed. To make a salt-water mixture, completely dissolve -1 tsp of salt in 1 cup of warm water.  Make sure that all household members wash their hands well.  Get plenty of rest.  Stay home from school or work until you have been taking antibiotics for 24 hours.  Keep all follow-up visits as told by your health care provider. This is important. SEEK MEDICAL CARE IF:  The glands in your neck continue to get bigger.  You develop a rash, cough, or earache.  You cough up a thick liquid that is green, yellow-brown, or bloody.  You have pain or discomfort that does not get better with medicine.  Your problems seem to be getting worse rather than better.  You have a fever. SEEK IMMEDIATE MEDICAL CARE IF:  You have new symptoms, such as vomiting, severe headache, stiff or painful neck, chest pain, or shortness of breath.  You have severe throat pain, drooling, or changes in your voice.  You have swelling of the neck, or the skin on the neck becomes red and tender.  You have signs of dehydration, such as fatigue, dry mouth, and decreased urination.  You become increasingly sleepy, or you cannot wake  up completely.  Your joints become red or painful.   This information is not intended to replace advice given to you by your health care provider. Make sure you discuss any questions you have with your health care provider.   Document Released: 12/11/2000 Document Revised: 09/04/2015 Document Reviewed: 04/08/2015 Elsevier Interactive Patient Education [employer].

## 2016-09-28 ENCOUNTER — Telehealth: Payer: Self-pay | Admitting: Family Medicine

## 2016-09-28 NOTE — Telephone Encounter (Signed)
Please advise 

## 2016-09-28 NOTE — Telephone Encounter (Signed)
Pt came in office requesting work note to be out today. Pt is still having sx of strep throat. Pt has scratchy throat and almost no voice. Please release note to mychart account.

## 2016-09-30 NOTE — Telephone Encounter (Signed)
Letter written and sent to the pt through MyChart.//AB/CMA

## 2016-09-30 NOTE — Telephone Encounter (Signed)
OK to do-

## 2016-10-09 ENCOUNTER — Encounter: Payer: Self-pay | Admitting: Family Medicine

## 2016-10-09 ENCOUNTER — Ambulatory Visit (INDEPENDENT_AMBULATORY_CARE_PROVIDER_SITE_OTHER): Payer: Managed Care, Other (non HMO) | Admitting: Family Medicine

## 2016-10-09 VITALS — BP 124/82 | HR 76 | Temp 97.9°F | Resp 16 | Ht 71.0 in | Wt 188.0 lb

## 2016-10-09 DIAGNOSIS — Z0000 Encounter for general adult medical examination without abnormal findings: Secondary | ICD-10-CM

## 2016-10-09 DIAGNOSIS — Z23 Encounter for immunization: Secondary | ICD-10-CM | POA: Diagnosis not present

## 2016-10-09 LAB — HEPATIC FUNCTION PANEL
ALT: 20 U/L (ref 0–53)
AST: 21 U/L (ref 0–37)
Albumin: 4.7 g/dL (ref 3.5–5.2)
Alkaline Phosphatase: 56 U/L (ref 39–117)
BILIRUBIN TOTAL: 0.6 mg/dL (ref 0.2–1.2)
Bilirubin, Direct: 0.1 mg/dL (ref 0.0–0.3)
TOTAL PROTEIN: 7.5 g/dL (ref 6.0–8.3)

## 2016-10-09 LAB — CBC WITH DIFFERENTIAL/PLATELET
BASOS ABS: 0 10*3/uL (ref 0.0–0.1)
BASOS PCT: 0.6 % (ref 0.0–3.0)
EOS ABS: 0.1 10*3/uL (ref 0.0–0.7)
Eosinophils Relative: 1.1 % (ref 0.0–5.0)
HCT: 45.2 % (ref 39.0–52.0)
HEMOGLOBIN: 14.9 g/dL (ref 13.0–17.0)
Lymphocytes Relative: 25.1 % (ref 12.0–46.0)
Lymphs Abs: 1.7 10*3/uL (ref 0.7–4.0)
MCHC: 33.1 g/dL (ref 30.0–36.0)
MCV: 87.7 fl (ref 78.0–100.0)
MONO ABS: 0.5 10*3/uL (ref 0.1–1.0)
Monocytes Relative: 6.9 % (ref 3.0–12.0)
NEUTROS ABS: 4.6 10*3/uL (ref 1.4–7.7)
Neutrophils Relative %: 66.3 % (ref 43.0–77.0)
PLATELETS: 279 10*3/uL (ref 150.0–400.0)
RBC: 5.16 Mil/uL (ref 4.22–5.81)
RDW: 12.6 % (ref 11.5–15.5)
WBC: 6.9 10*3/uL (ref 4.0–10.5)

## 2016-10-09 LAB — LIPID PANEL
CHOL/HDL RATIO: 5
Cholesterol: 202 mg/dL — ABNORMAL HIGH (ref 0–200)
HDL: 39.5 mg/dL (ref >39.00)
LDL CALC: 143 mg/dL — AB (ref 0–99)
NonHDL: 162.7
TRIGLYCERIDES: 98 mg/dL (ref 0.0–149.0)
VLDL: 19.6 mg/dL (ref 0.0–40.0)

## 2016-10-09 LAB — BASIC METABOLIC PANEL
BUN: 11 mg/dL (ref 6–23)
CHLORIDE: 103 meq/L (ref 96–112)
CO2: 31 mEq/L (ref 19–32)
CREATININE: 0.98 mg/dL (ref 0.40–1.50)
Calcium: 9.6 mg/dL (ref 8.4–10.5)
GFR: 107.71 mL/min (ref >60.00)
Glucose, Bld: 92 mg/dL (ref 70–99)
Potassium: 4.4 mEq/L (ref 3.5–5.1)
Sodium: 141 mEq/L (ref 135–145)

## 2016-10-09 LAB — TSH: TSH: 1.34 u[IU]/mL (ref 0.35–4.50)

## 2016-10-09 NOTE — Progress Notes (Signed)
Pre visit review using our clinic review tool, if applicable. No additional management support is needed unless otherwise documented below in the visit note. 

## 2016-10-09 NOTE — Addendum Note (Signed)
Addended by: Davis Gourd on: 10/09/2016 08:58 AM   Modules accepted: Orders

## 2016-10-09 NOTE — Assessment & Plan Note (Signed)
Pt's PE WNL.  Will update Tdap and give flu shot today.  Check labs.  Anticipatory guidance provided.

## 2016-10-09 NOTE — Patient Instructions (Signed)
Follow up in 1 year or as needed We'll notify you of your lab results and make any changes if needed Continue to work on healthy diet and regular exercise- you look great! Rest when you are able!!!  Babies are tough!!! Call with any questions or concerns CONGRATS!!!!

## 2016-10-09 NOTE — Progress Notes (Signed)
   Subjective:    Patient ID: Charles Gibson, male    DOB: 27-Aug-1976, 40 y.o.   MRN: [id number]  HPI CPE- pt would like to do Flu and Tdap today.  Too young for colonoscopy, prostate exam.   Review of Systems Patient reports no vision/hearing changes, anorexia, fever ,adenopathy, persistant/recurrent hoarseness, swallowing issues, chest pain, palpitations, edema, persistant/recurrent cough, hemoptysis, dyspnea (rest,exertional, paroxysmal nocturnal), gastrointestinal  bleeding (melena, rectal bleeding), abdominal pain, excessive heart burn, GU symptoms (dysuria, hematuria, voiding/incontinence issues) syncope, focal weakness, memory loss, numbness & tingling, skin/hair/nail changes, depression, anxiety, abnormal bruising/bleeding, musculoskeletal symptoms/signs.     Objective:   Physical Exam General Appearance:    Alert, cooperative, no distress, appears stated age  Head:    Normocephalic, without obvious abnormality, atraumatic  Eyes:    PERRL, conjunctiva/corneas clear, EOM's intact, fundi    benign, both eyes       Ears:    Normal TM's and external ear canals, both ears  Nose:   Nares normal, septum midline, mucosa normal, no drainage   or sinus tenderness  Throat:   Lips, mucosa, and tongue normal; teeth and gums normal  Neck:   Supple, symmetrical, trachea midline, no adenopathy;       thyroid:  No enlargement/tenderness/nodules  Back:     Symmetric, no curvature, ROM normal, no CVA tenderness  Lungs:     Clear to auscultation bilaterally, respirations unlabored  Chest wall:    No tenderness or deformity  Heart:    Regular rate and rhythm, S1 and S2 normal, no murmur, rub   or gallop  Abdomen:     Soft, non-tender, bowel sounds active all four quadrants,    no masses, no organomegaly  Genitalia:    Deferred at pt's request  Rectal:    Extremities:   Extremities normal, atraumatic, no cyanosis or edema  Pulses:   2+ and symmetric all extremities  Skin:   Skin color, texture,  turgor normal, no rashes or lesions  Lymph nodes:   Cervical, supraclavicular, and axillary nodes normal  Neurologic:   CNII-XII intact. Normal strength, sensation and reflexes      throughout          Assessment & Plan:

## 2017-02-17 ENCOUNTER — Ambulatory Visit: Payer: Managed Care, Other (non HMO) | Admitting: Family Medicine

## 2017-04-19 ENCOUNTER — Ambulatory Visit (INDEPENDENT_AMBULATORY_CARE_PROVIDER_SITE_OTHER): Payer: Managed Care, Other (non HMO) | Admitting: Family Medicine

## 2017-04-19 ENCOUNTER — Encounter: Payer: Self-pay | Admitting: Family Medicine

## 2017-04-19 VITALS — BP 122/80 | HR 86 | Temp 98.1°F | Resp 16 | Ht 71.0 in | Wt 192.4 lb

## 2017-04-19 DIAGNOSIS — B958 Unspecified staphylococcus as the cause of diseases classified elsewhere: Secondary | ICD-10-CM | POA: Diagnosis not present

## 2017-04-19 NOTE — Progress Notes (Signed)
   Subjective:    Patient ID: Charles Gibson, male    DOB: 1976/06/22, 41 y.o.   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  2 days later had weeping, oozing, yellow crusting.  Went to UC and was started on Doxy and Mupirocin ointment.  sxs are improving but area remains painful.   Review of Systems For ROS see HPI     Objective:   Physical Exam  Constitutional: He is oriented to person, place, and time. He appears well-developed and well-nourished. No distress.  HENT:  Head: Normocephalic and atraumatic.  Neurological: He is alert and oriented to person, place, and time.  Skin: Skin is warm and dry.  Some crusting under nose in nasolabial folds w/o surrounding erythema.  Psychiatric: He has a normal mood and affect. His behavior is normal.  Vitals reviewed.         Assessment & Plan:  Staph infxn- doing well on Doxy and mupirocin.  No medication changes at this time.  Continue Doxy as directed.  Reviewed supportive care and red flags that should prompt return.  Pt expressed understanding and is in agreement w/ plan.

## 2017-04-19 NOTE — Patient Instructions (Signed)
Follow up as needed/scheduled Finish the Doxycycline as directed- take w/ food Use the Ointment at night until the scabbing resolves It looks great!  You are well on your way to healing! Call with any questions or concerns Happy Spring!!!

## 2017-04-19 NOTE — Progress Notes (Signed)
Pre visit review using our clinic review tool, if applicable. No additional management support is needed unless otherwise documented below in the visit note. 

## 2017-05-17 ENCOUNTER — Ambulatory Visit (INDEPENDENT_AMBULATORY_CARE_PROVIDER_SITE_OTHER): Payer: Managed Care, Other (non HMO) | Admitting: Family Medicine

## 2017-05-17 ENCOUNTER — Encounter: Payer: Self-pay | Admitting: Family Medicine

## 2017-05-17 VITALS — BP 120/83 | HR 90 | Temp 98.1°F | Resp 16 | Ht 71.0 in | Wt 193.4 lb

## 2017-05-17 DIAGNOSIS — H00014 Hordeolum externum left upper eyelid: Secondary | ICD-10-CM | POA: Diagnosis not present

## 2017-05-17 NOTE — Patient Instructions (Signed)
Follow up as needed/scheduled This is a stye and should improve w/ time and supportive measures Apply a warm washcloth to the area for 10 minutes at a time multiple times daily You can use a cool compress to help w/ swelling/pain Tylenol/ibuprofen as needed Try and avoid rubbing your eyes If the area becomes more painful or more red- please let me know and we can start antibiotics Call with any questions or concerns Hang in there!!!

## 2017-05-17 NOTE — Progress Notes (Signed)
Pre visit review using our clinic review tool, if applicable. No additional management support is needed unless otherwise documented below in the visit note. 

## 2017-05-17 NOTE — Progress Notes (Signed)
   Subjective:    Patient ID: Charles Gibson, male    DOB: Jul 25, 1976, 41 y.o.   MRN: [id number]  HPI Swollen eye lid- L sided.  Had some discomfort for a few days and then developed redness and swelling of L upper lid yesterday.  Some discomfort- not painful.  No drainage.  No fevers.  No visual changes.   Review of Systems For ROS see HPI     Objective:   Physical Exam  Constitutional: He is oriented to person, place, and time. He appears well-developed and well-nourished. No distress.  HENT:  Head: Normocephalic and atraumatic.  Eyes: Conjunctivae and EOM are normal. Pupils are equal, round, and reactive to light. Right eye exhibits no discharge. Left eye exhibits no discharge.  L upper lid with small, mildly TTP stye in midline w/ overlying redness but no pus or fluctuance  Neurological: He is alert and oriented to person, place, and time.  Skin: Skin is warm and dry.  Psychiatric: He has a normal mood and affect. His behavior is normal. Thought content normal.  Vitals reviewed.         Assessment & Plan:  Stye- new.  No evidence of cellulitis or need for abx at this time.  Reviewed supportive care and red flags that should prompt return.  Pt expressed understanding and is in agreement w/ plan.

## 2017-05-18 ENCOUNTER — Encounter: Payer: Self-pay | Admitting: Family Medicine

## 2017-05-19 ENCOUNTER — Encounter: Payer: Self-pay | Admitting: Family Medicine

## 2017-05-19 MED ORDER — CEPHALEXIN 500 MG PO CAPS: 500.0000 mg | ORAL_CAPSULE | Freq: Two times a day (BID) | ORAL | 0 refills | Status: AC

## 2017-05-31 ENCOUNTER — Encounter: Payer: Self-pay | Admitting: Family Medicine

## 2017-06-09 ENCOUNTER — Encounter: Payer: Self-pay | Admitting: Family Medicine

## 2017-06-09 DIAGNOSIS — H00014 Hordeolum externum left upper eyelid: Secondary | ICD-10-CM

## 2017-06-13 ENCOUNTER — Encounter: Payer: Self-pay | Admitting: Family Medicine

## 2017-10-12 ENCOUNTER — Encounter: Payer: Managed Care, Other (non HMO) | Admitting: Family Medicine

## 2017-12-09 ENCOUNTER — Encounter (HOSPITAL_BASED_OUTPATIENT_CLINIC_OR_DEPARTMENT_OTHER): Payer: Self-pay | Admitting: Emergency Medicine

## 2017-12-09 ENCOUNTER — Other Ambulatory Visit: Payer: Self-pay

## 2017-12-09 ENCOUNTER — Emergency Department (HOSPITAL_BASED_OUTPATIENT_CLINIC_OR_DEPARTMENT_OTHER): Payer: No Typology Code available for payment source

## 2017-12-09 ENCOUNTER — Emergency Department (HOSPITAL_BASED_OUTPATIENT_CLINIC_OR_DEPARTMENT_OTHER)
Admission: EM | Admit: 2017-12-09 | Discharge: 2017-12-09 | Disposition: A | Discharge status: Home / Self Care | Payer: No Typology Code available for payment source | Attending: Emergency Medicine | Admitting: Emergency Medicine

## 2017-12-09 DIAGNOSIS — Z79899 Other long term (current) drug therapy: Secondary | ICD-10-CM | POA: Diagnosis not present

## 2017-12-09 DIAGNOSIS — S161XXA Strain of muscle, fascia and tendon at neck level, initial encounter: Secondary | ICD-10-CM

## 2017-12-09 DIAGNOSIS — Y999 Unspecified external cause status: Secondary | ICD-10-CM | POA: Insufficient documentation

## 2017-12-09 DIAGNOSIS — S39012A Strain of muscle, fascia and tendon of lower back, initial encounter: Secondary | ICD-10-CM

## 2017-12-09 DIAGNOSIS — Y9389 Activity, other specified: Secondary | ICD-10-CM | POA: Diagnosis not present

## 2017-12-09 DIAGNOSIS — Y92410 Unspecified street and highway as the place of occurrence of the external cause: Secondary | ICD-10-CM | POA: Diagnosis not present

## 2017-12-09 DIAGNOSIS — S199XXA Unspecified injury of neck, initial encounter: Secondary | ICD-10-CM | POA: Diagnosis present
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
## 2017-12-09 NOTE — ED Notes (Signed)
ED Provider at bedside. 
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
## 2017-12-09 NOTE — ED Notes (Signed)
Pt on monitor 

## 2018-01-05 ENCOUNTER — Ambulatory Visit (INDEPENDENT_AMBULATORY_CARE_PROVIDER_SITE_OTHER): Payer: Managed Care, Other (non HMO) | Admitting: Family Medicine

## 2018-01-05 ENCOUNTER — Encounter: Payer: Self-pay | Admitting: Family Medicine

## 2018-01-05 ENCOUNTER — Other Ambulatory Visit: Payer: Self-pay

## 2018-01-05 VITALS — BP 122/80 | HR 77 | Temp 98.0°F | Resp 16 | Ht 71.0 in | Wt 198.1 lb

## 2018-01-05 DIAGNOSIS — Z0000 Encounter for general adult medical examination without abnormal findings: Secondary | ICD-10-CM

## 2018-01-05 DIAGNOSIS — E785 Hyperlipidemia, unspecified: Secondary | ICD-10-CM

## 2018-01-05 DIAGNOSIS — Z23 Encounter for immunization: Secondary | ICD-10-CM

## 2018-01-05 MED ORDER — TRIAMCINOLONE ACETONIDE 0.1 % EX OINT: 1.0000 "application " | TOPICAL_OINTMENT | Freq: Two times a day (BID) | CUTANEOUS | 1 refills | Status: DC

## 2018-01-05 NOTE — Assessment & Plan Note (Signed)
Pt's PE WNL.  UTD on Urology- appt upcoming in Feb.  Flu shot today.  Check labs.  Anticipatory guidance provided.

## 2018-01-05 NOTE — Assessment & Plan Note (Signed)
Chronic problem.  Not taking Lipitor.  Check labs and start meds prn.  Pt expressed understanding and is in agreement w/ plan.

## 2018-01-05 NOTE — Patient Instructions (Signed)
Follow up in 6 months to recheck cholesterol We'll notify you of your lab results and make any changes if needed Continue to work on healthy diet and regular exercise- you look great! Call with any questions or concerns Happy New Year!!!

## 2018-01-05 NOTE — Progress Notes (Signed)
   Subjective:    Patient ID: Charles Gibson, male    DOB: 04/20/1976, 42 y.o.   MRN: [id number]  HPI CPE- due for flu shot, UTD on Tdap.  No concerns today.   Review of Systems Patient reports no vision/hearing changes, anorexia, fever ,adenopathy, persistant/recurrent hoarseness, swallowing issues, chest pain, palpitations, edema, persistant/recurrent cough, hemoptysis, dyspnea (rest,exertional, paroxysmal nocturnal), gastrointestinal  bleeding (melena, rectal bleeding), abdominal pain, excessive heart burn, GU symptoms (dysuria, hematuria, voiding/incontinence issues) syncope, focal weakness, memory loss, numbness & tingling, skin/hair/nail changes, depression, anxiety, abnormal bruising/bleeding, musculoskeletal symptoms/signs.     Objective:   Physical Exam General Appearance:    Alert, cooperative, no distress, appears stated age  Head:    Normocephalic, without obvious abnormality, atraumatic  Eyes:    PERRL, conjunctiva/corneas clear, EOM's intact, fundi    benign, both eyes       Ears:    Normal TM's and external ear canals, both ears  Nose:   Nares normal, septum midline, mucosa normal, no drainage   or sinus tenderness  Throat:   Lips, mucosa, and tongue normal; teeth and gums normal  Neck:   Supple, symmetrical, trachea midline, no adenopathy;       thyroid:  No enlargement/tenderness/nodules  Back:     Symmetric, no curvature, ROM normal, no CVA tenderness  Lungs:     Clear to auscultation bilaterally, respirations unlabored  Chest wall:    No tenderness or deformity  Heart:    Regular rate and rhythm, S1 and S2 normal, no murmur, rub   or gallop  Abdomen:     Soft, non-tender, bowel sounds active all four quadrants,    no masses, no organomegaly  Genitalia:    deferred  Rectal:    Extremities:   Extremities normal, atraumatic, no cyanosis or edema  Pulses:   2+ and symmetric all extremities  Skin:   Skin color, texture, turgor normal, no rashes or lesions  Lymph nodes:    Cervical, supraclavicular, and axillary nodes normal  Neurologic:   CNII-XII intact. Normal strength, sensation and reflexes      throughout          Assessment & Plan:

## 2018-01-06 ENCOUNTER — Encounter: Payer: Self-pay | Admitting: General Practice

## 2018-01-06 LAB — HEPATIC FUNCTION PANEL
AG Ratio: 1.5 (calc) (ref 1.0–2.5)
ALT: 15 U/L (ref 9–46)
AST: 17 U/L (ref 10–40)
Albumin: 4.7 g/dL (ref 3.6–5.1)
Alkaline phosphatase (APISO): 49 U/L (ref 40–115)
BILIRUBIN DIRECT: 0.1 mg/dL (ref 0.0–0.2)
BILIRUBIN TOTAL: 0.7 mg/dL (ref 0.2–1.2)
Globulin: 3.1 g/dL (calc) (ref 1.9–3.7)
Indirect Bilirubin: 0.6 mg/dL (calc) (ref 0.2–1.2)
Total Protein: 7.8 g/dL (ref 6.1–8.1)

## 2018-01-06 LAB — CBC WITH DIFFERENTIAL/PLATELET
BASOS ABS: 23 {cells}/uL (ref 0–200)
Basophils Relative: 0.4 %
EOS PCT: 1.1 %
Eosinophils Absolute: 63 cells/uL (ref 15–500)
HEMATOCRIT: 44.8 % (ref 38.5–50.0)
Hemoglobin: 15 g/dL (ref 13.2–17.1)
Lymphs Abs: 1955 cells/uL (ref 850–3900)
MCH: 28.3 pg (ref 27.0–33.0)
MCHC: 33.5 g/dL (ref 32.0–36.0)
MCV: 84.5 fL (ref 80.0–100.0)
MONOS PCT: 8.7 %
MPV: 10 fL (ref 7.5–12.5)
NEUTROS PCT: 55.5 %
Neutro Abs: 3164 cells/uL (ref 1500–7800)
Platelets: 208 10*3/uL (ref 140–400)
RBC: 5.3 10*6/uL (ref 4.20–5.80)
RDW: 12.3 % (ref 11.0–15.0)
Total Lymphocyte: 34.3 %
WBC mixed population: 496 cells/uL (ref 200–950)
WBC: 5.7 10*3/uL (ref 3.8–10.8)

## 2018-01-06 LAB — BASIC METABOLIC PANEL
BUN: 11 mg/dL (ref 7–25)
CALCIUM: 9.8 mg/dL (ref 8.6–10.3)
CHLORIDE: 100 mmol/L (ref 98–110)
CO2: 31 mmol/L (ref 20–32)
Creat: 1.1 mg/dL (ref 0.60–1.35)
Glucose, Bld: 86 mg/dL (ref 65–99)
Potassium: 3.9 mmol/L (ref 3.5–5.3)
SODIUM: 140 mmol/L (ref 135–146)

## 2018-01-06 LAB — LIPID PANEL
CHOLESTEROL: 226 mg/dL — AB (ref <200)
HDL: 40 mg/dL — AB (ref >40)
LDL CHOLESTEROL (CALC): 159 mg/dL — AB
Non-HDL Cholesterol (Calc): 186 mg/dL (calc) — ABNORMAL HIGH (ref <130)
TRIGLYCERIDES: 148 mg/dL (ref <150)
Total CHOL/HDL Ratio: 5.7 (calc) — ABNORMAL HIGH (ref <5.0)

## 2018-01-06 LAB — TSH: TSH: 1.31 mIU/L (ref 0.40–4.50)

## 2018-01-12 ENCOUNTER — Ambulatory Visit: Payer: Managed Care, Other (non HMO) | Admitting: Family Medicine

## 2018-01-12 ENCOUNTER — Other Ambulatory Visit: Payer: Self-pay

## 2018-01-12 ENCOUNTER — Encounter: Payer: Self-pay | Admitting: Family Medicine

## 2018-01-12 VITALS — BP 123/82 | HR 88 | Temp 97.9°F | Resp 16 | Ht 71.0 in | Wt 197.1 lb

## 2018-01-12 DIAGNOSIS — B9689 Other specified bacterial agents as the cause of diseases classified elsewhere: Secondary | ICD-10-CM | POA: Diagnosis not present

## 2018-01-12 DIAGNOSIS — J329 Chronic sinusitis, unspecified: Secondary | ICD-10-CM

## 2018-01-12 MED ORDER — AMOXICILLIN 875 MG PO TABS
875.0000 mg | ORAL_TABLET | Freq: Two times a day (BID) | ORAL | 0 refills | Status: DC
Start: 2018-01-12 — End: 2018-03-08

## 2018-01-12 NOTE — Patient Instructions (Signed)
Follow up as needed or as scheduled Start the Amoxicillin twice daily- take w/ food Drink plenty of fluids REST!!! Mucinex to thin congestion Call with any questions or concerns Hang in there!!!

## 2018-01-12 NOTE — Progress Notes (Signed)
   Subjective:    Patient ID: Charles Gibson, male    DOB: [birth year], 42 y.o.   MRN: [id number]  HPI URI- + sick contacts at home.  No fevers.  + facial pain/pressure, HA, nasal congestion.  sxs started Sunday.  No tooth pain.  Bilateral ear fullness.  No cough or sore throat.   Review of Systems For ROS see HPI     Objective:   Physical Exam  Constitutional: He is oriented to person, place, and time. He appears well-developed and well-nourished. No distress.  HENT:  Head: Normocephalic and atraumatic.  Right Ear: Tympanic membrane normal.  Left Ear: Tympanic membrane normal.  Nose: Mucosal edema and rhinorrhea present. Right sinus exhibits maxillary sinus tenderness and frontal sinus tenderness. Left sinus exhibits maxillary sinus tenderness and frontal sinus tenderness.  Mouth/Throat: Mucous membranes are normal. Oropharyngeal exudate and posterior oropharyngeal erythema present. No posterior oropharyngeal edema.  + PND  Eyes: Conjunctivae and EOM are normal. Pupils are equal, round, and reactive to light.  Neck: Normal range of motion. Neck supple.  Cardiovascular: Normal rate, regular rhythm and normal heart sounds.  Pulmonary/Chest: Effort normal and breath sounds normal. No respiratory distress. He has no wheezes.  Lymphadenopathy:    He has no cervical adenopathy.  Neurological: He is alert and oriented to person, place, and time.  Skin: Skin is warm and dry.  Psychiatric: He has a normal mood and affect. His behavior is normal. Thought content normal.  Vitals reviewed.         Assessment & Plan:  Sinusitis- pt's sxs and PE consistent w/ infxn.  Start abx.  Reviewed supportive care and red flags that should prompt return.  Pt expressed understanding and is in agreement w/ plan.

## 2018-03-08 ENCOUNTER — Encounter: Payer: Self-pay | Admitting: Family Medicine

## 2018-03-08 ENCOUNTER — Other Ambulatory Visit: Payer: Self-pay

## 2018-03-08 ENCOUNTER — Ambulatory Visit: Payer: Managed Care, Other (non HMO) | Admitting: Family Medicine

## 2018-03-08 VITALS — BP 131/80 | HR 85 | Temp 98.0°F | Resp 16 | Ht 71.0 in | Wt 195.2 lb

## 2018-03-08 DIAGNOSIS — J029 Acute pharyngitis, unspecified: Secondary | ICD-10-CM | POA: Diagnosis not present

## 2018-03-08 LAB — POCT RAPID STREP A (OFFICE): Rapid Strep A Screen: NEGATIVE

## 2018-03-08 NOTE — Patient Instructions (Signed)
Your strep test is negative- this is great news! This is appears to be viral and should improve w/ time Drink plenty of fluids REST! Mucinex DM for cough and congestion Alternate tylenol and ibuprofen every 4 hrs as needed for fever or body aches Call with any questions or concerns Hang in there!!!

## 2018-03-08 NOTE — Progress Notes (Signed)
   Subjective:    Patient ID: Charles Gibson, male    DOB: 1976/06/09, 42 y.o.   MRN: [id number]  HPI URI- son recently dx'd w/ pneumonia and conjunctivitis.  Yesterday had chills and temp to 101.  + sore throat, painful to swallow.  Mild nasal congestion.  Took Theraflu last night.  This AM started coughing 'dark mucous'.  No sinus pain/pressure.     Review of Systems For ROS see HPI     Objective:   Physical Exam  Constitutional: He appears well-developed and well-nourished. No distress.  HENT:  Head: Normocephalic and atraumatic.  No TTP over sinuses + turbinate edema + PND TMs normal bilaterally Mild pharyngeal erythema w/o tonsillar enlargement or exudate  Eyes: Conjunctivae and EOM are normal. Pupils are equal, round, and reactive to light.  Neck: Normal range of motion. Neck supple.  Cardiovascular: Normal rate, regular rhythm and normal heart sounds.  Pulmonary/Chest: Effort normal and breath sounds normal. No respiratory distress. He has no wheezes.  Lymphadenopathy:    He has no cervical adenopathy.  Skin: Skin is warm and dry.  Vitals reviewed.         Assessment & Plan:  Sore throat- suspect viral illness but given son's recent illness will do strep test.  Negative.  No need for abx.  Reviewed supportive care and red flags that should prompt return.  Pt expressed understanding and is in agreement w/ plan.

## 2018-03-09 ENCOUNTER — Encounter: Payer: Self-pay | Admitting: Family Medicine

## 2018-03-10 ENCOUNTER — Encounter: Payer: Self-pay | Admitting: General Practice

## 2018-03-21 ENCOUNTER — Encounter: Payer: Self-pay | Admitting: Family Medicine

## 2018-03-21 ENCOUNTER — Other Ambulatory Visit: Payer: Self-pay

## 2018-03-21 ENCOUNTER — Ambulatory Visit: Payer: Managed Care, Other (non HMO) | Admitting: Family Medicine

## 2018-03-21 VITALS — BP 121/80 | HR 84 | Temp 98.2°F | Resp 16 | Ht 71.0 in | Wt 196.4 lb

## 2018-03-21 DIAGNOSIS — H66001 Acute suppurative otitis media without spontaneous rupture of ear drum, right ear: Secondary | ICD-10-CM | POA: Diagnosis not present

## 2018-03-21 MED ORDER — FLUTICASONE PROPIONATE 50 MCG/ACT NA SUSP: 2.0000 | Freq: Every day | NASAL | 6 refills | Status: DC

## 2018-03-21 MED ORDER — AMOXICILLIN 875 MG PO TABS: 875.0000 mg | ORAL_TABLET | Freq: Two times a day (BID) | ORAL | 0 refills | Status: DC

## 2018-03-21 NOTE — Progress Notes (Signed)
   Subjective:    Patient ID: Charles Gibson, male    DOB: 1976-11-03, 42 y.o.   MRN: [id number]  HPI R ear congestion- pt had recent viral illness.  Since last week has had sensation of R ear fullness, 'like I have cotton in my ear'.  No drainage.  No pain.  No fevers.  Taking Zyrtec daily.   Review of Systems For ROS see HPI     Objective:   Physical Exam  Constitutional: He appears well-developed and well-nourished. No distress.  HENT:  Head: Normocephalic and atraumatic.  No TTP over sinuses + turbinate edema + PND R TM dull, visible purulent fluid, bulging  Eyes: Pupils are equal, round, and reactive to light. Conjunctivae and EOM are normal.  Neck: Normal range of motion. Neck supple.  Cardiovascular: Normal rate, regular rhythm and normal heart sounds.  Pulmonary/Chest: Effort normal and breath sounds normal. No respiratory distress. He has no wheezes.  Lymphadenopathy:    He has no cervical adenopathy.  Skin: Skin is warm and dry.  Vitals reviewed.         Assessment & Plan:  R OM- new.  Pt's sxs and PE consistent w/ eustachian tube dysfxn and early OM.  Start abx.  flonase daily.  Reviewed supportive care and red flags that should prompt return.  Pt expressed understanding and is in agreement w/ plan.

## 2018-03-21 NOTE — Patient Instructions (Signed)
Follow up as needed or as scheduled Start the Amoxicillin twice daily- take w/ food Drink plenty of fluids Continue the Zyrtec daily Add the Flonase- 2 sprays each nostril daily- to improve allergy congestion Call with any questions or concerns Hang in there!!!

## 2018-03-28 ENCOUNTER — Encounter: Payer: Self-pay | Admitting: Family Medicine

## 2018-03-30 ENCOUNTER — Encounter: Payer: Self-pay | Admitting: Family Medicine

## 2018-03-30 ENCOUNTER — Other Ambulatory Visit: Payer: Self-pay

## 2018-03-30 ENCOUNTER — Ambulatory Visit: Payer: Managed Care, Other (non HMO) | Admitting: Family Medicine

## 2018-03-30 VITALS — BP 122/80 | HR 84 | Temp 98.1°F | Resp 16 | Ht 71.0 in | Wt 197.5 lb

## 2018-03-30 DIAGNOSIS — J301 Allergic rhinitis due to pollen: Secondary | ICD-10-CM | POA: Diagnosis not present

## 2018-03-30 NOTE — Assessment & Plan Note (Signed)
Ongoing issue.  No evidence of infxn- this has resolved w/ abx.  His sxs are all allergy related.  Reviewed this w/ pt and supportive care.  Pt expressed understanding and is in agreement w/ plan.

## 2018-03-30 NOTE — Progress Notes (Signed)
   Subjective:    Patient ID: RUHAN BORAK, male    DOB: 1976-09-20, 42 y.o.   MRN: [id number]  HPI Ear pain- pt was seen 3/25 and dx'd w/ R OM.  Tx'd w/ Amoxicillin.  Pt reports ear will intermittently stop up.  Now having nasal congestion and some sore throat.  Taking generic for Zyrtec and Flonase.   Review of Systems For ROS see HPI     Objective:   Physical Exam  Constitutional: He appears well-developed and well-nourished. No distress.  HENT:  Head: Normocephalic and atraumatic.  No TTP over sinuses + turbinate edema + PND R TM mildly retracted, L TM WNL  Eyes: Pupils are equal, round, and reactive to light. Conjunctivae and EOM are normal.  Neck: Normal range of motion. Neck supple.  Cardiovascular: Normal rate, regular rhythm and normal heart sounds.  Pulmonary/Chest: Effort normal and breath sounds normal. No respiratory distress. He has no wheezes.  Lymphadenopathy:    He has no cervical adenopathy.  Skin: Skin is warm and dry.  Vitals reviewed.         Assessment & Plan:

## 2018-03-30 NOTE — Patient Instructions (Signed)
Follow up as needed or as scheduled Continue the Zyrtec and the Flonase daily ADD OTC decongestant- Sudafed or Phenylephrine- for a few days (2 or 3) to improve congestion and help with the ear pain Drink plenty of fluids Call with any questions or concerns Hang in there!!!

## 2018-04-04 ENCOUNTER — Ambulatory Visit: Payer: Managed Care, Other (non HMO) | Admitting: Family Medicine

## 2019-01-31 ENCOUNTER — Encounter: Payer: Self-pay | Admitting: Physician Assistant

## 2019-01-31 ENCOUNTER — Other Ambulatory Visit: Payer: Self-pay

## 2019-01-31 ENCOUNTER — Ambulatory Visit: Payer: Managed Care, Other (non HMO) | Admitting: Physician Assistant

## 2019-01-31 VITALS — BP 102/80 | HR 82 | Temp 98.2°F | Resp 14 | Ht 71.0 in | Wt 198.0 lb

## 2019-01-31 DIAGNOSIS — Z20828 Contact with and (suspected) exposure to other viral communicable diseases: Secondary | ICD-10-CM | POA: Diagnosis not present

## 2019-01-31 MED ORDER — OSELTAMIVIR PHOSPHATE 75 MG PO CAPS: 75.0000 mg | ORAL_CAPSULE | Freq: Every day | ORAL | 0 refills | Status: DC

## 2019-01-31 NOTE — Progress Notes (Signed)
Patient presents to clinic today c/o exposure to flu. Notes wife was diagnosed this morning with Flu A and started on Tamiflu. Her symptoms have been severe and started Sunday. Notes he has had some nasal congestion and fatigue since waking this morning. Feels this has gotten slightly worse. Denies fever, chills, aches, sore throat. Has had flu shot this year.    Past Medical History:  Diagnosis Date  . BPH (benign prostatic hyperplasia)    since 20's  . Prostate enlargement     Current Outpatient Medications on File Prior to Visit  Medication Sig Dispense Refill  . cetirizine (ZYRTEC) 10 MG tablet TAKE 1 TABLET BY MOUTH DAILY 90 tablet 0  . CIALIS 5 MG tablet Take 5 mg by mouth daily.   6   No current facility-administered medications on file prior to visit.     Allergies  Allergen Reactions  . Adhesive [Tape] Rash    Family History  Problem Relation Age of Onset  . Aortic aneurysm Father     Social History   Socioeconomic History  . Marital status: Married    Spouse name: Not on file  . Number of children: Not on file  . Years of education: Not on file  . Highest education level: Not on file  Occupational History  . Not on file  Social Needs  . Financial resource strain: Not on file  . Food insecurity:    Worry: Not on file    Inability: Not on file  . Transportation needs:    Medical: Not on file    Non-medical: Not on file  Tobacco Use  . Smoking status: Never Smoker  . Smokeless tobacco: Never Used  Substance and Sexual Activity  . Alcohol use: No    Alcohol/week: 0.0 standard drinks  . Drug use: No  . Sexual activity: Never  Lifestyle  . Physical activity:    Days per week: Not on file    Minutes per session: Not on file  . Stress: Not on file  Relationships  . Social connections:    Talks on phone: Not on file    Gets together: Not on file    Attends religious service: Not on file    Active member of club or organization: Not on file   Attends meetings of clubs or organizations: Not on file    Relationship status: Not on file  Other Topics Concern  . Not on file  Social History Narrative  . Not on file   Review of Systems - See HPI.  All other ROS are negative.  BP 102/80   Pulse 82   Temp 98.2 F (36.8 C) (Oral)   Resp 14   Ht 5\' 11"  (1.803 m)   Wt 198 lb (89.8 kg)   SpO2 96%   BMI 27.62 kg/m   Physical Exam Vitals signs reviewed.  Constitutional:      Appearance: Normal appearance.  HENT:     Head: Normocephalic and atraumatic.     Right Ear: Tympanic membrane normal.     Left Ear: Tympanic membrane normal.     Nose: Nose normal.  Neck:     Musculoskeletal: Neck supple.  Cardiovascular:     Rate and Rhythm: Normal rate and regular rhythm.     Heart sounds: Normal heart sounds.  Pulmonary:     Effort: Pulmonary effort is normal.     Breath sounds: Normal breath sounds.  Neurological:     General: No focal deficit  present.     Mental Status: He is alert and oriented to person, place, and time.    Assessment/Plan: 1. Exposure to the flu Exam unremarkable. Mild nasal congestion since yesterday. Start Tamiflu at preventive dose. Discussed common flu-like symptoms. If these develop within 24 hours he has been instructed to switch to BID dosing of Tamiflu. Supportive measures and OTC medications reviewed.   - oseltamivir (TAMIFLU) 75 MG capsule; Take 1 capsule (75 mg total) by mouth daily.  Dispense: 10 capsule; Refill: 0   Leeanne Rio, PA-C

## 2019-01-31 NOTE — Patient Instructions (Signed)
Please keep well-hydrated and get plenty of rest. Keep hands washed! Lysol is your friend! Start OTC Theraflu for symptom relief if you note increase in symptoms. (See below).  Take the Tamiflu once daily x 10 days as directed (preventive dose). If you note fever, chills, aches or worsening URI symptoms, start taking the Tamiflu as follows: - Take 1 capsule twice daily x 5 days.

## 2019-03-02 ENCOUNTER — Other Ambulatory Visit: Payer: Self-pay

## 2019-03-02 ENCOUNTER — Ambulatory Visit: Payer: Managed Care, Other (non HMO) | Admitting: Family Medicine

## 2019-03-02 ENCOUNTER — Encounter: Payer: Self-pay | Admitting: Family Medicine

## 2019-03-02 VITALS — BP 116/80 | HR 81 | Temp 98.1°F | Resp 16 | Ht 71.0 in | Wt 198.5 lb

## 2019-03-02 DIAGNOSIS — J069 Acute upper respiratory infection, unspecified: Secondary | ICD-10-CM

## 2019-03-02 NOTE — Progress Notes (Signed)
   Subjective:    Patient ID: Charles Gibson, male    DOB: 25-May-1976, 43 y.o.   MRN: [id number]  HPI URI- sxs started Saturday w/ 'scratchy throat'.  + sneezing, congestion, drainage.  Pt reports some bloody drainage when blowing nose.  Pt reports feeling better than this weekend.  No fevers.  Dull headache.  + sick contacts.  No tooth pain.   Review of Systems For ROS see HPI     Objective:   Physical Exam Constitutional:      General: He is not in acute distress.    Appearance: He is well-developed. He is not ill-appearing.  HENT:     Head: Normocephalic and atraumatic.     Right Ear: Tympanic membrane normal.     Left Ear: Tympanic membrane normal.     Nose: No mucosal edema or rhinorrhea.     Right Sinus: No maxillary sinus tenderness or frontal sinus tenderness.     Left Sinus: No maxillary sinus tenderness or frontal sinus tenderness.     Mouth/Throat:     Pharynx: No oropharyngeal exudate or posterior oropharyngeal erythema.  Eyes:     Conjunctiva/sclera: Conjunctivae normal.     Pupils: Pupils are equal, round, and reactive to light.  Neck:     Musculoskeletal: Normal range of motion and neck supple.  Cardiovascular:     Rate and Rhythm: Normal rate and regular rhythm.     Heart sounds: Normal heart sounds.  Pulmonary:     Effort: Pulmonary effort is normal. No respiratory distress.     Breath sounds: Normal breath sounds. No wheezing.     Comments: No cough heard Lymphadenopathy:     Cervical: No cervical adenopathy.  Skin:    General: Skin is warm and dry.  Psychiatric:        Mood and Affect: Mood normal.        Speech: Speech normal.        Behavior: Behavior normal.           Assessment & Plan:

## 2019-03-02 NOTE — Assessment & Plan Note (Signed)
Pt's sxs are consistent w/ resolving URI.  Did discuss possible 2nd sickening w/ a sinus infxn.  No need for abx.  Reviewed supportive care and red flags that should prompt return.  Pt expressed understanding and is in agreement w/ plan.

## 2019-03-02 NOTE — Patient Instructions (Signed)
Follow up as needed or as scheduled Drink plenty of fluids REST! Make sure you are taking your daily allergy medication Add Mucinex DM to thin chest congestion and help w/ cough Call with any questions or concerns Hang in there!!!
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
## 2019-11-21 ENCOUNTER — Encounter: Payer: Self-pay | Admitting: Family Medicine

## 2019-11-21 ENCOUNTER — Ambulatory Visit (INDEPENDENT_AMBULATORY_CARE_PROVIDER_SITE_OTHER): Payer: Managed Care, Other (non HMO) | Admitting: Family Medicine

## 2019-11-21 ENCOUNTER — Other Ambulatory Visit: Payer: Self-pay

## 2019-11-21 DIAGNOSIS — L309 Dermatitis, unspecified: Secondary | ICD-10-CM | POA: Diagnosis not present

## 2019-11-21 MED ORDER — TRIAMCINOLONE ACETONIDE 0.1 % EX OINT: 1.0000 "application " | TOPICAL_OINTMENT | Freq: Two times a day (BID) | CUTANEOUS | 1 refills | Status: AC

## 2019-11-21 NOTE — Progress Notes (Signed)
I have discussed the procedure for the virtual visit with the patient who has given consent to proceed with assessment and treatment.   Pt unable to obtain vitals.   Jessica L Brodmerkel, CMA     

## 2019-11-21 NOTE — Progress Notes (Signed)
   Virtual Visit via Video   I connected with patient on 11/21/19 at 11:00 AM EST by a video enabled telemedicine application and verified that I am speaking with the correct person using two identifiers.  Location patient: Home Location provider: Acupuncturist, Office Persons participating in the virtual visit: Patient, Provider, [place] (Jess B)  I discussed the limitations of evaluation and management by telemedicine and the availability of in person appointments. The patient expressed understanding and agreed to proceed.  Subjective:   HPI:   Dry skin- pt is having dry flaking skin behind ears and along hairline.  Hx of similar.  Previously used Triamcinolone w/ good results.  ROS:   See pertinent positives and negatives per HPI.  Patient Active Problem List   Diagnosis Date Noted  . Influenza-like illness 03/19/2016  . Allergic rhinitis 05/31/2015  . Knee pain, left 04/25/2015  . 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 Routine general medical examination at a health care facility 08/21/2014  . Hyperlipidemia 08/21/2014  . RUQ pain 02/19/2014  . 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 URI (upper respiratory infection) 02/19/2014    Social History   Tobacco Use  . Smoking status: Never Smoker  . Smokeless tobacco: Never Used  Substance Use Topics  . Alcohol use: No    Alcohol/week: 0.0 standard drinks    Current Outpatient Medications:  .  cetirizine (ZYRTEC) 10 MG tablet, TAKE 1 TABLET BY MOUTH DAILY, Disp: 90 tablet, Rfl: 0 .  CIALIS 5 MG tablet, Take 5 mg by mouth daily. , Disp: , Rfl: 6  Allergies  Allergen Reactions  . Adhesive [Tape] Rash    Objective:   There were no vitals taken for this visit.  AAOx3, NAD NCAT, EOMI No obvious CN deficits Coloring WNL Mild flaking of skin along hairline Pt is able to speak clearly, coherently without shortness of breath or increased work of breathing.  Thought process is linear.  Mood is appropriate.   Assessment and  Plan:   Eczema- pt has hx of similar that responded to topical tx w/ triamcinolone.  Refill provided.  Pt to notify if things don't improve.  Pt expressed understanding and is in agreement w/ plan.    Annye Asa, MD 11/21/2019  \

## 2019-12-15 ENCOUNTER — Ambulatory Visit: Payer: Managed Care, Other (non HMO) | Attending: Internal Medicine

## 2019-12-15 DIAGNOSIS — Z20822 Contact with and (suspected) exposure to covid-19: Secondary | ICD-10-CM

## 2019-12-16 LAB — NOVEL CORONAVIRUS, NAA: SARS-CoV-2, NAA: NOT DETECTED

## 2020-02-08 ENCOUNTER — Encounter: Payer: Self-pay | Admitting: Family Medicine

## 2020-02-08 ENCOUNTER — Ambulatory Visit (INDEPENDENT_AMBULATORY_CARE_PROVIDER_SITE_OTHER): Payer: Managed Care, Other (non HMO) | Admitting: Family Medicine

## 2020-02-08 ENCOUNTER — Other Ambulatory Visit: Payer: Self-pay

## 2020-02-08 DIAGNOSIS — J310 Chronic rhinitis: Secondary | ICD-10-CM | POA: Diagnosis not present

## 2020-02-08 NOTE — Progress Notes (Signed)
I have discussed the procedure for the virtual visit with the patient who has given consent to proceed with assessment and treatment.   Pt unable to obtain vitals.   Marieke Lubke L Aristidis Talerico, CMA     

## 2020-02-08 NOTE — Progress Notes (Signed)
   Virtual Visit via Video   I connected with patient on 02/08/20 at  4:00 PM EST by a video enabled telemedicine application and verified that I am speaking with the correct person using two identifiers.  Location patient: Home Location provider: Acupuncturist, Office Persons participating in the virtual visit: Patient, Provider, [place] (Jess B)  I discussed the limitations of evaluation and management by telemedicine and the availability of in person appointments. The patient expressed understanding and agreed to proceed.  Subjective:   HPI:   URI- pt reports L frontal HA starting a few days ago.  Had bloody drainage from L nostril.  No fevers, chills, aches.  No tooth pain.  No ear pain.  + runny nose.  Not currently taking allergy medication.  ROS:   See pertinent positives and negatives per HPI.  Patient Active Problem List   Diagnosis Date Noted  . Influenza-like illness 03/19/2016  . Allergic rhinitis 05/31/2015  . Knee pain, left 04/25/2015  . 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 Routine general medical examination at a health care facility 08/21/2014  . Hyperlipidemia 08/21/2014  . RUQ pain 02/19/2014  . 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 URI (upper respiratory infection) 02/19/2014    Social History   Tobacco Use  . Smoking status: Never Smoker  . Smokeless tobacco: Never Used  Substance Use Topics  . Alcohol use: No    Alcohol/week: 0.0 standard drinks    Current Outpatient Medications:  .  cetirizine (ZYRTEC) 10 MG tablet, TAKE 1 TABLET BY MOUTH DAILY, Disp: 90 tablet, Rfl: 0 .  CIALIS 5 MG tablet, Take 5 mg by mouth daily. , Disp: , Rfl: 6 .  triamcinolone ointment (KENALOG) 0.1 %, Apply 1 application topically 2 (two) times daily., Disp: 80 g, Rfl: 1  Allergies  Allergen Reactions  . Adhesive [Tape] Rash    Objective:   There were no vitals taken for this visit. AAOx3, NAD Well appearing NCAT, EOMI No obvious CN deficits Coloring WNL Pt is  able to speak clearly, coherently without shortness of breath or increased work of breathing.  Thought process is linear.  Mood is appropriate.   Assessment and Plan:   Rhinitis- pt has hx of allergic rhinitis.  Suspect his mild frontal headache is allergy inflammation and his bloody nasal drainage is due to the dry environment.  Encouraged him to restart Zyrtec and use nasal saline to moisten nasal passages.  Reviewed supportive care and red flags that should prompt return.  Pt expressed understanding and is in agreement w/ plan.    Annye Asa, MD 02/08/2020

## 2021-06-25 ENCOUNTER — Encounter: Payer: Self-pay | Admitting: *Deleted

## 2021-10-08 ENCOUNTER — Other Ambulatory Visit: Payer: Self-pay

## 2021-10-08 ENCOUNTER — Ambulatory Visit (INDEPENDENT_AMBULATORY_CARE_PROVIDER_SITE_OTHER): Payer: Managed Care, Other (non HMO) | Admitting: Family Medicine

## 2021-10-08 ENCOUNTER — Encounter: Payer: Self-pay | Admitting: Family Medicine

## 2021-10-08 VITALS — BP 125/80 | HR 62 | Temp 97.9°F | Resp 17 | Ht 72.0 in | Wt 185.0 lb

## 2021-10-08 DIAGNOSIS — Z1211 Encounter for screening for malignant neoplasm of colon: Secondary | ICD-10-CM

## 2021-10-08 DIAGNOSIS — Z0000 Encounter for general adult medical examination without abnormal findings: Secondary | ICD-10-CM | POA: Diagnosis not present

## 2021-10-08 DIAGNOSIS — D2371 Other benign neoplasm of skin of right lower limb, including hip: Secondary | ICD-10-CM | POA: Diagnosis not present

## 2021-10-08 DIAGNOSIS — E785 Hyperlipidemia, unspecified: Secondary | ICD-10-CM

## 2021-10-08 DIAGNOSIS — Z23 Encounter for immunization: Secondary | ICD-10-CM

## 2021-10-08 LAB — CBC WITH DIFFERENTIAL/PLATELET
Basophils Absolute: 0 10*3/uL (ref 0.0–0.1)
Basophils Relative: 0.8 % (ref 0.0–3.0)
Eosinophils Absolute: 0.1 10*3/uL (ref 0.0–0.7)
Eosinophils Relative: 1 % (ref 0.0–5.0)
HCT: 46.1 % (ref 39.0–52.0)
Hemoglobin: 15 g/dL (ref 13.0–17.0)
Lymphocytes Relative: 29.2 % (ref 12.0–46.0)
Lymphs Abs: 1.6 10*3/uL (ref 0.7–4.0)
MCHC: 32.5 g/dL (ref 30.0–36.0)
MCV: 89 fl (ref 78.0–100.0)
Monocytes Absolute: 0.4 10*3/uL (ref 0.1–1.0)
Monocytes Relative: 7.2 % (ref 3.0–12.0)
Neutro Abs: 3.5 10*3/uL (ref 1.4–7.7)
Neutrophils Relative %: 61.8 % (ref 43.0–77.0)
Platelets: 208 10*3/uL (ref 150.0–400.0)
RBC: 5.18 Mil/uL (ref 4.22–5.81)
RDW: 13 % (ref 11.5–15.5)
WBC: 5.6 10*3/uL (ref 4.0–10.5)

## 2021-10-08 LAB — LIPID PANEL
Cholesterol: 227 mg/dL — ABNORMAL HIGH (ref 0–200)
HDL: 52.9 mg/dL (ref >39.00)
LDL Cholesterol: 153 mg/dL — ABNORMAL HIGH (ref 0–99)
NonHDL: 174.28
Total CHOL/HDL Ratio: 4
Triglycerides: 104 mg/dL (ref 0.0–149.0)
VLDL: 20.8 mg/dL (ref 0.0–40.0)

## 2021-10-08 LAB — HEPATIC FUNCTION PANEL
ALT: 14 U/L (ref 0–53)
AST: 18 U/L (ref 0–37)
Albumin: 4.7 g/dL (ref 3.5–5.2)
Alkaline Phosphatase: 50 U/L (ref 39–117)
Bilirubin, Direct: 0.1 mg/dL (ref 0.0–0.3)
Total Bilirubin: 0.6 mg/dL (ref 0.2–1.2)
Total Protein: 7.8 g/dL (ref 6.0–8.3)

## 2021-10-08 LAB — BASIC METABOLIC PANEL
BUN: 12 mg/dL (ref 6–23)
CO2: 31 mEq/L (ref 19–32)
Calcium: 9.9 mg/dL (ref 8.4–10.5)
Chloride: 102 mEq/L (ref 96–112)
Creatinine, Ser: 1.05 mg/dL (ref 0.40–1.50)
GFR: 84.65 mL/min (ref >60.00)
Glucose, Bld: 78 mg/dL (ref 70–99)
Potassium: 4.1 mEq/L (ref 3.5–5.1)
Sodium: 141 mEq/L (ref 135–145)

## 2021-10-08 LAB — TSH: TSH: 1.44 u[IU]/mL (ref 0.35–5.50)

## 2021-10-08 LAB — PSA: PSA: 1.99 ng/mL (ref 0.10–4.00)

## 2021-10-08 NOTE — Assessment & Plan Note (Signed)
Attempting to control w/ diet and exercise.  Check labs.  Determine if medication is needed.

## 2021-10-08 NOTE — Progress Notes (Signed)
   Subjective:    Patient ID: Charles Gibson, male    DOB: Jun 30, 1976, 45 y.o.   MRN: [id number]  HPI CPE- due for colonoscopy.  UTD on Tdap.  Due for flu.  Patient Care Team    Relationship Specialty Notifications Start End  Midge Minium, MD PCP - General Family Medicine  02/19/14   Rana Snare, MD (Inactive) Consulting Physician Urology  08/21/14     Health Maintenance  Topic Date Due   COVID-19 Vaccine (1) Never done   HIV Screening  Never done   Hepatitis C Screening  Never done   COLONOSCOPY (Pts 45-43yrs Insurance coverage will need to be confirmed)  Never done   INFLUENZA VACCINE  07/28/2021   TETANUS/TDAP  10/09/2026   HPV VACCINES  Aged Out      Review of Systems Patient reports no vision/hearing changes, anorexia, fever ,adenopathy, persistant/recurrent hoarseness, swallowing issues, chest pain, palpitations, edema, persistant/recurrent cough, hemoptysis, dyspnea (rest,exertional, paroxysmal nocturnal), gastrointestinal  bleeding (melena, rectal bleeding), abdominal pain, excessive heart burn, GU symptoms (dysuria, hematuria, voiding/incontinence issues) syncope, focal weakness, memory loss, numbness & tingling, skin/hair/nail changes, depression, anxiety, abnormal bruising/bleeding, musculoskeletal symptoms/signs.   This visit occurred during the SARS-CoV-2 public health emergency.  Safety protocols were in place, including screening questions prior to the visit, additional usage of staff PPE, and extensive cleaning of exam room while observing appropriate contact time as indicated for disinfecting solutions.      Objective:   Physical Exam General Appearance:    Alert, cooperative, no distress, appears stated age  Head:    Normocephalic, without obvious abnormality, atraumatic  Eyes:    PERRL, conjunctiva/corneas clear, EOM's intact, fundi    benign, both eyes       Ears:    Normal TM's and external ear canals, both ears  Nose:   Deferred due to COVID  Throat:    Neck:   Supple, symmetrical, trachea midline, no adenopathy;       thyroid:  No enlargement/tenderness/nodules  Back:     Symmetric, no curvature, ROM normal, no CVA tenderness  Lungs:     Clear to auscultation bilaterally, respirations unlabored  Chest wall:    No tenderness or deformity  Heart:    Regular rate and rhythm, S1 and S2 normal, no murmur, rub   or gallop  Abdomen:     Soft, non-tender, bowel sounds active all four quadrants,    no masses, no organomegaly  Genitalia:    Deferred  Rectal:    Extremities:   Extremities normal, atraumatic, no cyanosis or edema  Pulses:   2+ and symmetric all extremities  Skin:   Skin color, texture, turgor normal, no rashes or lesions.  Dermatofibroma on R knee  Lymph nodes:   Cervical, supraclavicular, and axillary nodes normal  Neurologic:   CNII-XII intact. Normal strength, sensation and reflexes      throughout          Assessment & Plan:

## 2021-10-08 NOTE — Patient Instructions (Signed)
Follow up in 1 year or as needed We'll notify you of your lab results and make any changes if needed Continue to work on healthy diet and regular exercise- you're doing great! We'll call you with your GI appt and Dermatology appts Call with any questions or concerns Happy Fall!!

## 2021-10-08 NOTE — Addendum Note (Signed)
Addended by: Genevie Cheshire L on: 10/08/2021 11:18 AM   Modules accepted: Orders

## 2021-10-08 NOTE — Assessment & Plan Note (Signed)
Pt's PE WNL.  UTD on Tdap.  Referral placed for colonoscopy.  Flu shot given today.  Check labs.  Anticipatory guidance provided.

## 2021-10-27 ENCOUNTER — Other Ambulatory Visit: Payer: Self-pay | Admitting: Family Medicine

## 2021-10-29 MED ORDER — CIALIS 5 MG PO TABS: 5.0000 mg | ORAL_TABLET | Freq: Every day | ORAL | 6 refills | Status: DC

## 2021-11-28 ENCOUNTER — Encounter: Payer: Self-pay | Admitting: Family Medicine

## 2021-12-01 MED ORDER — TADALAFIL 5 MG PO TABS: 5.0000 mg | ORAL_TABLET | Freq: Every day | ORAL | 11 refills | Status: DC

## 2022-08-02 ENCOUNTER — Telehealth: Payer: Managed Care, Other (non HMO) | Admitting: Nurse Practitioner

## 2022-08-02 DIAGNOSIS — U071 COVID-19: Secondary | ICD-10-CM | POA: Diagnosis not present

## 2022-08-02 MED ORDER — MOLNUPIRAVIR EUA 200MG CAPSULE: 4.0000 | ORAL_CAPSULE | Freq: Two times a day (BID) | ORAL | 0 refills | Status: AC

## 2022-08-02 NOTE — Progress Notes (Signed)
Virtual Visit Consent   NICOLUS OSE, you are scheduled for a virtual visit with a [facility] provider today. Just as with appointments in the office, your consent must be obtained to participate. Your consent will be active for this visit and any virtual visit you may have with one of our providers in the next 365 days. If you have a MyChart account, a copy of this consent can be sent to you electronically.  As this is a virtual visit, video technology does not allow for your provider to perform a traditional examination. This may limit your provider's ability to fully assess your condition. If your provider identifies any concerns that need to be evaluated in person or the need to arrange testing (such as labs, EKG, etc.), we will make arrangements to do so. Although advances in technology are sophisticated, we cannot ensure that it will always work on either your end or our end. If the connection with a video visit is poor, the visit may have to be switched to a telephone visit. With either a video or telephone visit, we are not always able to ensure that we have a secure connection.  By engaging in this virtual visit, you consent to the provision of healthcare and authorize for your insurance to be billed (if applicable) for the services provided during this visit. Depending on your insurance coverage, you may receive a charge related to this service.  I need to obtain your verbal consent now. Are you willing to proceed with your visit today? Charles Gibson has provided verbal consent on 08/02/2022 for a virtual visit (video or telephone). Gildardo Pounds, NP  Date: 08/02/2022 4:30 PM  Virtual Visit via Video Note   I, Gildardo Pounds, connected with  Charles Gibson  ([id number], July 15, 1976) on 08/02/22 at  4:15 PM EDT by a video-enabled telemedicine application and verified that I am speaking with the correct person using two identifiers.  Location: Patient: Virtual Visit Location Patient:  Home Provider: Virtual Visit Location Provider: Home Office   I discussed the limitations of evaluation and management by telemedicine and the availability of in person appointments. The patient expressed understanding and agreed to proceed.    History of Present Illness: Charles Gibson is a 46 y.o. who identifies as a male who was assigned male at birth, and is being seen today for [place].  Tested positive for COVID via home test today. Symptoms onset 2 days ago with the following complaints: productive cough, nasal congestion/drainage, mild shortness of breath. Denies fever, headache or chest pain.   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   Routine general medical examination at a health care facility 08/21/2014   Hyperlipidemia 08/21/2014   RUQ pain 02/19/2014   Renal cyst, right 02/19/2014   URI (upper respiratory infection) 02/19/2014    Allergies:  Allergies  Allergen Reactions   Adhesive [Tape] Rash   Medications:  Current Outpatient Medications:    molnupiravir EUA (LAGEVRIO) 200 mg CAPS capsule, Take 4 capsules (800 mg total) by mouth 2 (two) times daily for 5 days., Disp: 40 capsule, Rfl: 0   cetirizine (ZYRTEC) 10 MG tablet, TAKE 1 TABLET BY MOUTH DAILY, Disp: 90 tablet, Rfl: 0   tadalafil (CIALIS) 5 MG tablet, Take 1 tablet (5 mg total) by mouth daily., Disp: 30 tablet, Rfl: 11  Observations/Objective: Patient is well-developed, well-nourished in no acute distress.  Resting comfortably at home.  Head is normocephalic, atraumatic.  No labored breathing.  Speech is clear and coherent with logical content.  Patient is alert and oriented at baseline.    Assessment and Plan: 1. Positive self-administered antigen test for COVID-19 - molnupiravir EUA (LAGEVRIO) 200 mg CAPS capsule; Take 4 capsules (800 mg  total) by mouth 2 (two) times daily for 5 days.  Dispense: 40 capsule; Refill: 0 INSTRUCTIONS: use a humidifier for nasal congestion Drink plenty of fluids, rest and wash hands frequently to avoid the spread of infection Alternate tylenol and Motrin for relief of fever   Follow Up Instructions: I discussed the assessment and treatment plan with the patient. The patient was provided an opportunity to ask questions and all were answered. The patient agreed with the plan and demonstrated an understanding of the instructions.  A copy of instructions were sent to the patient via MyChart unless otherwise noted below.    The patient was advised to call back or seek an in-person evaluation if the symptoms worsen or if the condition fails to improve as anticipated.  Time:  I spent 11 minutes with the patient via telehealth technology discussing the above problems/concerns.    Gildardo Pounds, NP

## 2022-09-30 ENCOUNTER — Encounter: Payer: Self-pay | Admitting: Family Medicine

## 2022-09-30 LAB — HM DIABETES EYE EXAM

## 2022-10-13 ENCOUNTER — Ambulatory Visit (INDEPENDENT_AMBULATORY_CARE_PROVIDER_SITE_OTHER): Payer: Managed Care, Other (non HMO) | Admitting: Family Medicine

## 2022-10-13 ENCOUNTER — Encounter: Payer: Self-pay | Admitting: Family Medicine

## 2022-10-13 VITALS — BP 134/76 | HR 67 | Temp 98.1°F | Resp 16 | Ht 70.75 in | Wt 189.2 lb

## 2022-10-13 DIAGNOSIS — Z0000 Encounter for general adult medical examination without abnormal findings: Secondary | ICD-10-CM | POA: Diagnosis not present

## 2022-10-13 DIAGNOSIS — E785 Hyperlipidemia, unspecified: Secondary | ICD-10-CM | POA: Diagnosis not present

## 2022-10-13 DIAGNOSIS — Z125 Encounter for screening for malignant neoplasm of prostate: Secondary | ICD-10-CM

## 2022-10-13 DIAGNOSIS — Z1211 Encounter for screening for malignant neoplasm of colon: Secondary | ICD-10-CM

## 2022-10-13 DIAGNOSIS — Z114 Encounter for screening for human immunodeficiency virus [HIV]: Secondary | ICD-10-CM

## 2022-10-13 DIAGNOSIS — Z23 Encounter for immunization: Secondary | ICD-10-CM

## 2022-10-13 LAB — CBC WITH DIFFERENTIAL/PLATELET
Basophils Absolute: 0 10*3/uL (ref 0.0–0.1)
Basophils Relative: 0.4 % (ref 0.0–3.0)
Eosinophils Absolute: 0.1 10*3/uL (ref 0.0–0.7)
Eosinophils Relative: 1 % (ref 0.0–5.0)
HCT: 45.2 % (ref 39.0–52.0)
Hemoglobin: 15 g/dL (ref 13.0–17.0)
Lymphocytes Relative: 30.2 % (ref 12.0–46.0)
Lymphs Abs: 1.6 10*3/uL (ref 0.7–4.0)
MCHC: 33.1 g/dL (ref 30.0–36.0)
MCV: 87.9 fl (ref 78.0–100.0)
Monocytes Absolute: 0.5 10*3/uL (ref 0.1–1.0)
Monocytes Relative: 10.2 % (ref 3.0–12.0)
Neutro Abs: 3.1 10*3/uL (ref 1.4–7.7)
Neutrophils Relative %: 58.2 % (ref 43.0–77.0)
Platelets: 226 10*3/uL (ref 150.0–400.0)
RBC: 5.15 Mil/uL (ref 4.22–5.81)
RDW: 13.3 % (ref 11.5–15.5)
WBC: 5.3 10*3/uL (ref 4.0–10.5)

## 2022-10-13 LAB — BASIC METABOLIC PANEL
BUN: 12 mg/dL (ref 6–23)
CO2: 31 mEq/L (ref 19–32)
Calcium: 9.6 mg/dL (ref 8.4–10.5)
Chloride: 102 mEq/L (ref 96–112)
Creatinine, Ser: 1.12 mg/dL (ref 0.40–1.50)
GFR: 77.79 mL/min (ref >60.00)
Glucose, Bld: 99 mg/dL (ref 70–99)
Potassium: 4.6 mEq/L (ref 3.5–5.1)
Sodium: 140 mEq/L (ref 135–145)

## 2022-10-13 LAB — LIPID PANEL
Cholesterol: 239 mg/dL — ABNORMAL HIGH (ref 0–200)
HDL: 45.3 mg/dL (ref >39.00)
LDL Cholesterol: 169 mg/dL — ABNORMAL HIGH (ref 0–99)
NonHDL: 193.86
Total CHOL/HDL Ratio: 5
Triglycerides: 122 mg/dL (ref 0.0–149.0)
VLDL: 24.4 mg/dL (ref 0.0–40.0)

## 2022-10-13 LAB — HEPATIC FUNCTION PANEL
ALT: 17 U/L (ref 0–53)
AST: 16 U/L (ref 0–37)
Albumin: 4.6 g/dL (ref 3.5–5.2)
Alkaline Phosphatase: 48 U/L (ref 39–117)
Bilirubin, Direct: 0.1 mg/dL (ref 0.0–0.3)
Total Bilirubin: 0.6 mg/dL (ref 0.2–1.2)
Total Protein: 7.4 g/dL (ref 6.0–8.3)

## 2022-10-13 LAB — TSH: TSH: 2.1 u[IU]/mL (ref 0.35–5.50)

## 2022-10-13 LAB — PSA: PSA: 1.72 ng/mL (ref 0.10–4.00)

## 2022-10-13 NOTE — Assessment & Plan Note (Signed)
Pt's PE WNL.  UTD on Tdap.  Flu shot given.  Due for colonoscopy- re-referred.  Check labs.  Anticipatory guidance provided.

## 2022-10-13 NOTE — Patient Instructions (Addendum)
Follow up in 1 year or as needed We'll notify you of your lab results and make any changes if needed Keep up the good work on healthy diet and regular exercise- you're doing great! We'll call you with your GI appt for the colonoscopy consultation Call with any questions or concerns Stay Safe!  Stay Healthy! Happy Fall!!!

## 2022-10-13 NOTE — Assessment & Plan Note (Signed)
Check labs and determine if medication is needed 

## 2022-10-13 NOTE — Progress Notes (Signed)
   Subjective:    Patient ID: Charles Gibson, male    DOB: 1976-12-10, 46 y.o.   MRN: [id number]  HPI CPE- UTD on Tdap.  Will get flu shot today.  Due for colonoscopy- referral placed  Patient Care Team    Relationship Specialty Notifications Start End  Midge Minium, MD PCP - General Family Medicine  02/19/14   Rana Snare, MD (Inactive) Consulting Physician Urology  08/21/14     Health Maintenance  Topic Date Due   HIV Screening  Never done   INFLUENZA VACCINE  07/28/2022   COVID-19 Vaccine (1) 10/29/2022 (Originally 06/21/1979)   COLONOSCOPY (Pts 45-14yr Insurance coverage will need to be confirmed)  10/14/2023 (Originally 06/21/2019)   TETANUS/TDAP  10/09/2026   HPV VACCINES  Aged Out   Hepatitis C Screening  Discontinued      Review of Systems Patient reports no vision/hearing changes, anorexia, fever ,adenopathy, persistant/recurrent hoarseness, swallowing issues, chest pain, palpitations, edema, persistant/recurrent cough, hemoptysis, dyspnea (rest,exertional, paroxysmal nocturnal), gastrointestinal  bleeding (melena, rectal bleeding), abdominal pain, excessive heart burn, GU symptoms (dysuria, hematuria, voiding/incontinence issues) syncope, focal weakness, memory loss, numbness & tingling, skin/hair/nail changes, depression, anxiety, abnormal bruising/bleeding, musculoskeletal symptoms/signs.     Objective:   Physical Exam General Appearance:    Alert, cooperative, no distress, appears stated age  Head:    Normocephalic, without obvious abnormality, atraumatic  Eyes:    PERRL, conjunctiva/corneas clear, EOM's intact both eyes       Ears:    Normal TM's and external ear canals, both ears  Nose:   Nares normal, septum midline, mucosa normal, no drainage   or sinus tenderness  Throat:   Lips, mucosa, and tongue normal; teeth and gums normal  Neck:   Supple, symmetrical, trachea midline, no adenopathy;       thyroid:  No enlargement/tenderness/nodules  Back:      Symmetric, no curvature, ROM normal, no CVA tenderness  Lungs:     Clear to auscultation bilaterally, respirations unlabored  Chest wall:    No tenderness or deformity  Heart:    Regular rate and rhythm, S1 and S2 normal, no murmur, rub   or gallop  Abdomen:     Soft, non-tender, bowel sounds active all four quadrants,    no masses, no organomegaly  Genitalia:    Normal male without lesion, masses,discharge or tenderness  Rectal:    Deferred due to young age  Extremities:   Extremities normal, atraumatic, no cyanosis or edema  Pulses:   2+ and symmetric all extremities  Skin:   Skin color, texture, turgor normal, no rashes or lesions  Lymph nodes:   Cervical, supraclavicular, and axillary nodes normal  Neurologic:   CNII-XII intact. Normal strength, sensation and reflexes      throughout          Assessment & Plan:

## 2022-10-14 LAB — HIV ANTIBODY (ROUTINE TESTING W REFLEX): HIV 1&2 Ab, 4th Generation: NONREACTIVE

## 2022-10-15 ENCOUNTER — Other Ambulatory Visit: Payer: Self-pay

## 2022-10-15 DIAGNOSIS — E785 Hyperlipidemia, unspecified: Secondary | ICD-10-CM

## 2022-10-15 DIAGNOSIS — R7989 Other specified abnormal findings of blood chemistry: Secondary | ICD-10-CM

## 2022-10-15 MED ORDER — SIMVASTATIN 20 MG PO TABS: 20.0000 mg | ORAL_TABLET | Freq: Every day | ORAL | 3 refills | Status: DC

## 2022-10-15 NOTE — Progress Notes (Signed)
Pt has scheduled his lab only visit order is in and we made his 6 month f/u apt as well. Rx sent to pharmacy

## 2022-11-13 ENCOUNTER — Ambulatory Visit (AMBULATORY_SURGERY_CENTER): Payer: Self-pay | Admitting: *Deleted

## 2022-11-13 VITALS — Ht 71.0 in | Wt 191.2 lb

## 2022-11-13 DIAGNOSIS — Z1211 Encounter for screening for malignant neoplasm of colon: Secondary | ICD-10-CM

## 2022-11-13 MED ORDER — NA SULFATE-K SULFATE-MG SULF 17.5-3.13-1.6 GM/177ML PO SOLN: 1.0000 | Freq: Once | ORAL | 0 refills | Status: AC

## 2022-11-13 NOTE — Progress Notes (Signed)
No egg or soy allergy known to patient  No issues known to pt with past sedation with any surgeries or procedures- no past anesthesia Pt denies trouble moving neck No FH of Malignant Hyperthermia Pt is not on diet pills Pt is not on  home 02  Pt is not on blood thinners  Pt denies issues with constipation  Pt encouraged to use to use Singlecare or Goodrx to reduce cost  Patient's chart reviewed by Osvaldo Angst CNRA prior to previsit and patient appropriate for the [place].  Previsit completed and red dot placed by patient's name on their procedure day (on provider's schedule).

## 2022-11-18 ENCOUNTER — Other Ambulatory Visit: Payer: Managed Care, Other (non HMO)

## 2022-11-26 ENCOUNTER — Other Ambulatory Visit: Payer: Managed Care, Other (non HMO)

## 2022-12-02 ENCOUNTER — Encounter: Payer: Self-pay | Admitting: Gastroenterology

## 2022-12-06 ENCOUNTER — Encounter: Payer: Self-pay | Admitting: Gastroenterology

## 2022-12-07 ENCOUNTER — Encounter: Payer: Managed Care, Other (non HMO) | Admitting: Gastroenterology

## 2022-12-07 NOTE — Telephone Encounter (Signed)
Left message for pt to call back  °

## 2022-12-16 ENCOUNTER — Other Ambulatory Visit: Payer: Self-pay | Admitting: Family Medicine

## 2023-01-28 ENCOUNTER — Other Ambulatory Visit: Payer: Self-pay | Admitting: Family Medicine

## 2023-02-04 ENCOUNTER — Other Ambulatory Visit: Payer: Self-pay | Admitting: Family Medicine

## 2023-03-10 ENCOUNTER — Other Ambulatory Visit: Payer: Self-pay | Admitting: Family Medicine

## 2023-04-13 ENCOUNTER — Ambulatory Visit: Payer: Managed Care, Other (non HMO) | Admitting: Family Medicine

## 2023-04-13 ENCOUNTER — Encounter: Payer: Self-pay | Admitting: Family Medicine

## 2023-04-13 VITALS — BP 124/86 | HR 71 | Temp 98.0°F | Resp 17 | Ht 71.0 in | Wt 192.1 lb

## 2023-04-13 DIAGNOSIS — E785 Hyperlipidemia, unspecified: Secondary | ICD-10-CM | POA: Diagnosis not present

## 2023-04-13 LAB — LIPID PANEL
Cholesterol: 195 mg/dL (ref 0–200)
HDL: 42.3 mg/dL (ref >39.00)
LDL Cholesterol: 131 mg/dL — ABNORMAL HIGH (ref 0–99)
NonHDL: 152.94
Total CHOL/HDL Ratio: 5
Triglycerides: 110 mg/dL (ref 0.0–149.0)
VLDL: 22 mg/dL (ref 0.0–40.0)

## 2023-04-13 LAB — BASIC METABOLIC PANEL
BUN: 12 mg/dL (ref 6–23)
CO2: 29 mEq/L (ref 19–32)
Calcium: 9 mg/dL (ref 8.4–10.5)
Chloride: 104 mEq/L (ref 96–112)
Creatinine, Ser: 0.96 mg/dL (ref 0.40–1.50)
GFR: 93.27 mL/min (ref >60.00)
Glucose, Bld: 90 mg/dL (ref 70–99)
Potassium: 3.7 mEq/L (ref 3.5–5.1)
Sodium: 140 mEq/L (ref 135–145)

## 2023-04-13 LAB — HEPATIC FUNCTION PANEL
ALT: 13 U/L (ref 0–53)
AST: 15 U/L (ref 0–37)
Albumin: 4.4 g/dL (ref 3.5–5.2)
Alkaline Phosphatase: 41 U/L (ref 39–117)
Bilirubin, Direct: 0.1 mg/dL (ref 0.0–0.3)
Total Bilirubin: 0.7 mg/dL (ref 0.2–1.2)
Total Protein: 6.9 g/dL (ref 6.0–8.3)

## 2023-04-13 NOTE — Progress Notes (Signed)
   Subjective:    Patient ID: Charles Gibson, male    DOB: November 28, 1976, 47 y.o.   MRN: [id number]  HPI Hyperlipidemia- pt's last LDL 169.  He was prescribed a statin but not taking this.  Pt reports he is feeling good.  No CP, SOB, abd pain, N/V.  Has been traveling quite a bit.  Exercising regularly- strength training and jumping rope   Review of Systems For ROS see HPI     Objective:   Physical Exam Vitals reviewed.  Constitutional:      General: He is not in acute distress.    Appearance: Normal appearance. He is well-developed. He is not ill-appearing.  HENT:     Head: Normocephalic and atraumatic.  Eyes:     Extraocular Movements: Extraocular movements intact.     Conjunctiva/sclera: Conjunctivae normal.     Pupils: Pupils are equal, round, and reactive to light.  Neck:     Thyroid: No thyromegaly.  Cardiovascular:     Rate and Rhythm: Normal rate and regular rhythm.     Pulses: Normal pulses.     Heart sounds: Normal heart sounds. No murmur heard. Pulmonary:     Effort: Pulmonary effort is normal. No respiratory distress.     Breath sounds: Normal breath sounds.  Abdominal:     General: Bowel sounds are normal. There is no distension.     Palpations: Abdomen is soft.  Musculoskeletal:     Cervical back: Normal range of motion and neck supple.     Right lower leg: No edema.     Left lower leg: No edema.  Lymphadenopathy:     Cervical: No cervical adenopathy.  Skin:    General: Skin is warm and dry.  Neurological:     General: No focal deficit present.     Mental Status: He is alert and oriented to person, place, and time.     Cranial Nerves: No cranial nerve deficit.  Psychiatric:        Mood and Affect: Mood normal.        Behavior: Behavior normal.           Assessment & Plan:

## 2023-04-13 NOTE — Assessment & Plan Note (Signed)
Chronic problem.  Is not taking prescribed statin at this time.  Is exercising regularly.  Check labs and start medication if needed.  Pt expressed understanding and is in agreement w/ plan.

## 2023-04-13 NOTE — Patient Instructions (Signed)
Schedule your complete physical in 6 months We'll notify you of your lab results and make any changes if needed Keep up the good work on healthy diet and regular exercise- you're doing great! Call with any questions or concerns Happy [place]!!! 

## 2023-04-14 ENCOUNTER — Telehealth: Payer: Self-pay

## 2023-04-14 NOTE — Telephone Encounter (Signed)
-----   Message from Sheliah Hatch, MD sent at 04/14/2023  7:28 AM EDT ----- Labs look good!  No changes at this time

## 2023-04-14 NOTE — Telephone Encounter (Signed)
Pt aware of results 

## 2023-04-18 ENCOUNTER — Other Ambulatory Visit: Payer: Self-pay | Admitting: Family Medicine

## 2023-05-16 ENCOUNTER — Telehealth: Payer: Managed Care, Other (non HMO) | Admitting: Family Medicine

## 2023-05-16 DIAGNOSIS — H1032 Unspecified acute conjunctivitis, left eye: Secondary | ICD-10-CM

## 2023-05-16 MED ORDER — POLYMYXIN B-TRIMETHOPRIM 10000-0.1 UNIT/ML-% OP SOLN: 1.0000 [drp] | OPHTHALMIC | 0 refills | Status: AC

## 2023-05-16 NOTE — Patient Instructions (Signed)
Bacterial Conjunctivitis, Adult Bacterial conjunctivitis is an infection of the clear membrane that covers the white part of the eye and the inner surface of the eyelid (conjunctiva). When the blood vessels in the conjunctiva become inflamed, the eye becomes red or pink. The eye often feels irritated or itchy. Bacterial conjunctivitis spreads easily from person to person (is contagious). It also spreads easily from one eye to the other eye. What are the causes? This condition is caused by bacteria. You may get the infection if you come into close contact with: A person who is infected with the bacteria. Items that are contaminated with the bacteria, such as a face towel, contact lens solution, or eye makeup. What increases the risk? You are more likely to develop this condition if: You are exposed to other people who have the infection. You wear contact lenses. You have a sinus infection. You have had a recent eye injury or surgery. You have a weak body defense system (immune system). You have a medical condition that causes dry eyes. What are the signs or symptoms? Symptoms of this condition include: Thick, yellowish discharge from the eye. This may turn into a crust on the eyelid overnight and cause your eyelids to stick together. Tearing or watery eyes. Itchy eyes. Burning feeling in your eyes. Eye redness. Swollen eyelids. Blurred vision. How is this diagnosed? This condition is diagnosed based on your symptoms and medical history. Your health care provider may also take a sample of discharge from your eye to find the cause of your infection. How is this treated? This condition may be treated with: Antibiotic eye drops or ointment to clear the infection more quickly and prevent the spread of infection to others. Antibiotic medicines taken by mouth (orally) to treat infections that do not respond to drops or ointments or that last longer than 10 days. Cool, wet cloths (cool  compresses) placed on the eyes. Artificial tears applied 2-6 times a day. Follow these instructions at home: Medicines Take or apply your antibiotic medicine as told by your health care provider. Do not stop using the antibiotic, even if your condition improves, unless directed by your health care provider. Take or apply over-the-counter and prescription medicines only as told by your health care provider. Be very careful to avoid touching the edge of your eyelid with the eye-drop bottle or the ointment tube when you apply medicines to the affected eye. This will keep you from spreading the infection to your other eye or to other people. Managing discomfort Gently wipe away any drainage from your eye with a warm, wet washcloth or a cotton ball. Apply a clean, cool compress to your eye for 10-20 minutes, 3-4 times a day. General instructions Do not wear contact lenses until the inflammation is gone and your health care provider says it is safe to wear them again. Ask your health care provider how to sterilize or replace your contact lenses before you use them again. Wear glasses until you can resume wearing contact lenses. Avoid wearing eye makeup until the inflammation is gone. Throw away any old eye cosmetics that may be contaminated. Change or wash your pillowcase every day. Do not share towels or washcloths. This may spread the infection. Wash your hands often with soap and water for at least 20 seconds and especially before touching your face or eyes. Use paper towels to dry your hands. Avoid touching or rubbing your eyes. Do not drive or use heavy machinery if your vision is blurred. Contact   a health care provider if: You have a fever. Your symptoms do not get better after 10 days. Get help right away if: You have a fever and your symptoms suddenly get worse. You have severe pain when you move your eye. You have facial pain, redness, or swelling. You have a sudden loss of  vision. Summary Bacterial conjunctivitis is an infection of the clear membrane that covers the white part of the eye and the inner surface of the eyelid (conjunctiva). Bacterial conjunctivitis spreads easily from eye to eye and from person to person (is contagious). Wash your hands often with soap and water for at least 20 seconds and especially before touching your face or eyes. Use paper towels to dry your hands. Take or apply your antibiotic medicine as told by your health care provider. Do not stop using the antibiotic even if your condition improves. Contact a health care provider if you have a fever or if your symptoms do not get better after 10 days. Get help right away if you have a sudden loss of vision. This information is not intended to replace advice given to you by your health care provider. Make sure you discuss any questions you have with your health care provider. Document Revised: 03/26/2021 Document Reviewed: 03/26/2021 Elsevier Patient Education  2023 [employer] Inc[employer]  

## 2023-05-16 NOTE — Progress Notes (Signed)
Virtual Visit Consent   Charles Gibson, you are scheduled for a virtual visit with a [facility] provider today. Just as with appointments in the office, your consent must be obtained to participate. Your consent will be active for this visit and any virtual visit you may have with one of our providers in the next 365 days. If you have a MyChart account, a copy of this consent can be sent to you electronically.  As this is a virtual visit, video technology does not allow for your provider to perform a traditional examination. This may limit your provider's ability to fully assess your condition. If your provider identifies any concerns that need to be evaluated in person or the need to arrange testing (such as labs, EKG, etc.), we will make arrangements to do so. Although advances in technology are sophisticated, we cannot ensure that it will always work on either your end or our end. If the connection with a video visit is poor, the visit may have to be switched to a telephone visit. With either a video or telephone visit, we are not always able to ensure that we have a secure connection.  By engaging in this virtual visit, you consent to the provision of healthcare and authorize for your insurance to be billed (if applicable) for the services provided during this visit. Depending on your insurance coverage, you may receive a charge related to this service.  I need to obtain your verbal consent now. Are you willing to proceed with your visit today? Charles Gibson has provided verbal consent on 05/16/2023 for a virtual visit (video or telephone). Georgana Curio, FNP  Date: 05/16/2023 6:39 PM  Virtual Visit via Video Note   I, Georgana Curio, connected with  Charles Gibson  ([id number], [birth year]) on 05/16/23 at  6:30 PM EDT by a video-enabled telemedicine application and verified that I am speaking with the correct person using two identifiers.  Location: Patient: Virtual Visit Location Patient:  Home Provider: Virtual Visit Location Provider: Home Office   I discussed the limitations of evaluation and management by telemedicine and the availability of in person appointments. The patient expressed understanding and agreed to proceed.    History of Present Illness: Charles Gibson is a 47 y.o. who identifies as a male who was assigned male at birth, and is being seen today for redness, drainage and irritation with swelling of left eye over 2 weeks now worsening. 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 (Patient not taking: Reported on 04/13/2023), Disp: 90 tablet, Rfl: 3   tadalafil (CIALIS) 5 MG tablet, Take 1 tablet by mouth once daily, Disp: 30 tablet, Rfl: 0  Observations/Objective: Patient is well-developed, well-nourished in no acute distress.  Resting comfortably  at home.  Head is normocephalic, atraumatic.  No labored breathing.  Speech is clear and coherent with logical content.  Patient is alert and oriented  at baseline.  Left eye is red and mildly swollen.   Assessment and Plan: 1. Acute bacterial conjunctivitis of left eye  Warm compresses ways to prevent transmission discussed, UC if sx worsen.   Follow Up Instructions: I discussed the assessment and treatment plan with the patient. The patient was provided an opportunity to ask questions and all were answered. The patient agreed with the  plan and demonstrated an understanding of the instructions.  A copy of instructions were sent to the patient via MyChart unless otherwise noted below.     The patient was advised to call back or seek an in-person evaluation if the symptoms worsen or if the condition fails to improve as anticipated.  Time:  I spent 10 minutes with the patient via telehealth technology discussing the above problems/concerns.    Georgana Curio, FNP

## 2023-05-25 ENCOUNTER — Other Ambulatory Visit: Payer: Self-pay | Admitting: Family Medicine

## 2023-07-09 ENCOUNTER — Other Ambulatory Visit: Payer: Self-pay | Admitting: Family Medicine

## 2023-08-25 ENCOUNTER — Other Ambulatory Visit: Payer: Self-pay | Admitting: Family Medicine

## 2023-10-01 DIAGNOSIS — Z01818 Encounter for other preprocedural examination: Secondary | ICD-10-CM | POA: Insufficient documentation

## 2023-10-01 DIAGNOSIS — N401 Enlarged prostate with lower urinary tract symptoms: Secondary | ICD-10-CM | POA: Insufficient documentation

## 2023-10-04 ENCOUNTER — Other Ambulatory Visit: Payer: Self-pay | Admitting: Family Medicine

## 2023-10-05 DIAGNOSIS — M2740 Unspecified cyst of jaw: Secondary | ICD-10-CM | POA: Insufficient documentation

## 2023-10-14 ENCOUNTER — Encounter: Payer: Self-pay | Admitting: Family Medicine

## 2023-10-15 ENCOUNTER — Encounter: Payer: Managed Care, Other (non HMO) | Admitting: Family Medicine

## 2023-11-25 ENCOUNTER — Telehealth: Payer: Managed Care, Other (non HMO) | Admitting: Family Medicine

## 2023-11-25 DIAGNOSIS — N390 Urinary tract infection, site not specified: Secondary | ICD-10-CM | POA: Diagnosis not present

## 2023-11-25 MED ORDER — CEPHALEXIN 500 MG PO CAPS
500.0000 mg | ORAL_CAPSULE | Freq: Two times a day (BID) | ORAL | 0 refills | Status: AC
Start: 2023-11-25 — End: 2023-12-02

## 2023-11-25 NOTE — Progress Notes (Signed)

## 2024-05-03 ENCOUNTER — Ambulatory Visit: Admitting: Family Medicine

## 2024-05-03 ENCOUNTER — Encounter: Payer: Self-pay | Admitting: Family Medicine

## 2024-05-03 VITALS — BP 118/72 | HR 78 | Temp 97.8°F | Ht 71.0 in | Wt 183.4 lb

## 2024-05-03 DIAGNOSIS — H66001 Acute suppurative otitis media without spontaneous rupture of ear drum, right ear: Secondary | ICD-10-CM | POA: Diagnosis not present

## 2024-05-03 MED ORDER — AZITHROMYCIN 250 MG PO TABS: ORAL_TABLET | ORAL | 0 refills | Status: DC

## 2024-05-03 NOTE — Patient Instructions (Signed)
 Follow up as needed or as scheduled FINISH the Augmentin as directed START the Azithromycin- 2 today and 1 daily starting tomorrow ADD Claritin or Zyrtec  daily to help w/ allergies and itchy, waxy ears Drink LOTS of fluids Call with any questions or concerns Hang in there!!!

## 2024-05-03 NOTE — Progress Notes (Signed)
   Subjective:    Patient ID: Charles Gibson, male    DOB: [birth year], 48 y.o.   MRN: [id number]  HPI Ear pain- pt was seen at [facility] on 5/2 and dx'd w/ serous OM.  Started on Mucinex and nasal spray.  Returned on 5/3 and was dx'd w/ L OM.  Started on Augmentin BID.  Today is day 5 of abx.  L side is feeling better but now R ear is throbbing and feels there is a 'bubble' in his ear.  No fever.  + sick contacts.   Review of Systems For ROS see HPI     Objective:   Physical Exam Vitals reviewed.  Constitutional:      General: He is not in acute distress.    Appearance: Normal appearance. He is not ill-appearing.  HENT:     Head: Normocephalic and atraumatic.     Right Ear: A middle ear effusion is present. Tympanic membrane is erythematous.     Left Ear: Tympanic membrane and ear canal normal.     Nose: No congestion.     Comments: No TTP over frontal or maxillary sinuses Skin:    General: Skin is warm and dry.  Neurological:     General: No focal deficit present.     Mental Status: He is alert and oriented to person, place, and time.  Psychiatric:        Mood and Affect: Mood normal.        Behavior: Behavior normal.        Thought Content: Thought content normal.           Assessment & Plan:  Otitis media- new.  R sided.  Reviewed UC notes x2.  L ear is now WNL after 5 days of Augmentin but R ear is now infected despite taking abx.  Will add Azithromycin to remaining Augmentin doses.  Reviewed supportive care and red flags that should prompt return.  Pt expressed understanding and is in agreement w/ plan.

## 2024-05-17 ENCOUNTER — Encounter: Payer: Self-pay | Admitting: Family Medicine

## 2024-05-17 ENCOUNTER — Ambulatory Visit: Admitting: Family Medicine

## 2024-05-17 VITALS — BP 110/70 | HR 77 | Temp 97.9°F | Ht 71.0 in | Wt 183.0 lb

## 2024-05-17 DIAGNOSIS — H66005 Acute suppurative otitis media without spontaneous rupture of ear drum, recurrent, left ear: Secondary | ICD-10-CM

## 2024-05-17 MED ORDER — CEFDINIR 300 MG PO CAPS: 300.0000 mg | ORAL_CAPSULE | Freq: Two times a day (BID) | ORAL | 0 refills | Status: DC

## 2024-05-17 NOTE — Progress Notes (Signed)
   Subjective:    Patient ID: Charles Gibson, male    DOB: 02-09-1976, 48 y.o.   MRN: [id number]  HPI Ear pain- pt was seen 5/7 and dx'd w/ R OM.  Tx'd w/ Zpack.  Now having L ear pain.  Currently on nasal spray and Mucinex.  Pt reports sxs had resolved- no pain, no headaches.  Has had ongoing sensation of 'ocean in my ears'.     Review of Systems For ROS see HPI     Objective:   Physical Exam Vitals reviewed.  Constitutional:      General: He is not in acute distress.    Appearance: Normal appearance. He is not ill-appearing.  HENT:     Head: Normocephalic and atraumatic.     Right Ear: Tympanic membrane and ear canal normal.     Left Ear: Ear canal normal. A middle ear effusion is present. Tympanic membrane is erythematous and bulging.     Mouth/Throat:     Mouth: Mucous membranes are moist.     Pharynx: No oropharyngeal exudate or posterior oropharyngeal erythema.  Eyes:     Extraocular Movements: Extraocular movements intact.     Conjunctiva/sclera: Conjunctivae normal.  Cardiovascular:     Rate and Rhythm: Normal rate and regular rhythm.  Pulmonary:     Effort: Pulmonary effort is normal. No respiratory distress.  Musculoskeletal:     Cervical back: Neck supple.  Lymphadenopathy:     Cervical: No cervical adenopathy.  Skin:    General: Skin is warm and dry.  Neurological:     General: No focal deficit present.     Mental Status: He is alert and oriented to person, place, and time.  Psychiatric:        Mood and Affect: Mood normal.        Behavior: Behavior normal.           Assessment & Plan:  Recurrent OM- new.  Pt initially had L OM that resolved w/ Augmentin.  Then developed R OM while on abx tx and Azithromycin  was added.  Sxs resolved temporarily and now w/ pain on L again.  Obvious pus behind TM.  Will start Omnicef in hopes of treating infxn that has been recurrent/resistant to abx.  Pt expressed understanding and is in agreement w/ plan.

## 2024-05-17 NOTE — Patient Instructions (Addendum)
 Follow up as needed or as scheduled START the Cefdinir twice daily- take w/ food Drink LOTS of fluids Tylenol or Ibuprofen as needed for pain or fever Call with any questions or concerns Hang in there! Happy Memorial Day!!!

## 2024-05-23 ENCOUNTER — Encounter: Payer: Self-pay | Admitting: Family Medicine

## 2024-05-23 DIAGNOSIS — H66005 Acute suppurative otitis media without spontaneous rupture of ear drum, recurrent, left ear: Secondary | ICD-10-CM

## 2024-06-01 NOTE — Telephone Encounter (Addendum)
 Called patient to let him know only one provider is in office tomorrow but did state that sometimes people will cancel and he could give us  a call first thing in the morning. Patient stated he might go to urgent care tonight. Dr.Vincent did hear the conversation and instructed me to have him added on at one of his chart review time slots. I did let patient know to come in at 9:20am Tomorrow. I do not have override access but I did give MRN to front desk and they are trying to add patient to schedule.

## 2024-06-01 NOTE — Telephone Encounter (Signed)
 Patient notes ear pain is returning and wonders if it is time for a specialist, he does have an appointment next week (06/05/2024) to see you

## 2024-06-02 ENCOUNTER — Encounter: Payer: Self-pay | Admitting: Student in an Organized Health Care Education/Training Program

## 2024-06-02 ENCOUNTER — Ambulatory Visit: Admitting: Student in an Organized Health Care Education/Training Program

## 2024-06-02 VITALS — BP 151/75 | HR 55 | Wt 184.0 lb

## 2024-06-02 DIAGNOSIS — H6692 Otitis media, unspecified, left ear: Secondary | ICD-10-CM | POA: Insufficient documentation

## 2024-06-02 MED ORDER — CEFUROXIME AXETIL 500 MG PO TABS
500.0000 mg | ORAL_TABLET | Freq: Two times a day (BID) | ORAL | 0 refills | Status: AC
Start: 2024-06-02 — End: 2024-06-12

## 2024-06-02 MED ORDER — FLUTICASONE PROPIONATE 50 MCG/ACT NA SUSP
2.0000 | Freq: Every day | NASAL | 6 refills | Status: DC
Start: 2024-06-02 — End: 2024-11-10

## 2024-06-02 NOTE — Assessment & Plan Note (Signed)
 Recurrent severe otitis media of the left tympanic membrane.  It is totally red at this point with no normal landmarks.  Etiology of the recurrent nature is unclear in this person who does not have diabetes nor tobacco use or other source of immunosuppression.  He has mild allergic rhinitis so may have eustachian tube dysfunction.  We talked about the use of intranasal Flonase .  Because of the active inflammation and discomfort of the ear, I am going to restart his now fourth round of antibiotic with cefuroxime 5 mg twice daily for 10 days.  Because he has gotten better with each round of antibiotics, do not suspect this is strep pneumo resistance.  He has been referred to ENT, I have asked our referral person to expedite this.  They can consider if there is a structural cause of this recurrent otitis media, and consider tympanocentesis for culture.

## 2024-06-02 NOTE — Progress Notes (Signed)
   Established Patient Office Visit  Subjective   Patient ID: Charles Gibson, male    DOB: 09-11-1976  Age: 48 y.o. MRN: [id number]  Chief Complaint  Patient presents with   Ear Pain     Has seen Dr.Tabori a couple times for ear infections in the past month and Dr.Tabori has put in a STAT referral for ENT yesterday. Patient states he has had headaches,ear aches in the last 24 hours and believes he is getting another ear infection.     HPI  48 year old person here for a frustrating and recurrent issue of ear discomfort and drainage.  Since late April he has had 2 episodes of acute otitis media, now looks to have his third.  This is affected his right ear once in his left ear twice now.  Today his symptoms are mostly focused on the left ear.  Last course of antibiotics finished about 7 days ago.  He had good improvement in discomfort and drainage as he took the Omnicef .  Starting about 2 days ago he developed some increasing tenderness of the ear canal and started to having increasing drainage in the left ear.  This morning he woke up with some decreased hearing, like cotton balls in his ears.  Denies any decreased hearing now in the office, this cleared up.  No fevers or chills.  No sinus drainage or sinus pressure.  No other recent changes in his health, immune status, or medications.     Objective:     BP (!) 151/75   Pulse (!) 55   Wt 184 lb (83.5 kg)   BMI 25.66 kg/m    Physical Exam  Gen: Well-appearing man Ears: Left ear has moderate inflammation of the ear canal, there is serous drainage, the left tympanic membrane is very erythematous, no light reflex, I do not see any normal landmarks, the right ear tympanic membrane is mildly bulging, but it is normal light reflex and has no erythema or posterior effusion    Assessment & Plan:   Problem List Items Addressed This Visit       Unprioritized   OM (otitis media), recurrent, left - Primary   Recurrent severe otitis media of  the left tympanic membrane.  It is totally red at this point with no normal landmarks.  Etiology of the recurrent nature is unclear in this person who does not have diabetes nor tobacco use or other source of immunosuppression.  He has mild allergic rhinitis so may have eustachian tube dysfunction.  We talked about the use of intranasal Flonase .  Because of the active inflammation and discomfort of the ear, I am going to restart his now fourth round of antibiotic with cefuroxime 5 mg twice daily for 10 days.  Because he has gotten better with each round of antibiotics, do not suspect this is strep pneumo resistance.  He has been referred to ENT, I have asked our referral person to expedite this.  They can consider if there is a structural cause of this recurrent otitis media, and consider tympanocentesis for culture.      Relevant Medications   cefUROXime (CEFTIN) 500 MG tablet   fluticasone  (FLONASE ) 50 MCG/ACT nasal spray    Hortencia Martire Thomas Korde Jeppsen, MD

## 2024-06-05 ENCOUNTER — Ambulatory Visit: Admitting: Family Medicine

## 2024-07-25 ENCOUNTER — Institutional Professional Consult (permissible substitution) (INDEPENDENT_AMBULATORY_CARE_PROVIDER_SITE_OTHER): Admitting: Otolaryngology

## 2024-07-25 ENCOUNTER — Ambulatory Visit (INDEPENDENT_AMBULATORY_CARE_PROVIDER_SITE_OTHER): Admitting: Audiology

## 2024-07-26 ENCOUNTER — Other Ambulatory Visit: Payer: Self-pay | Admitting: Family Medicine

## 2024-08-17 ENCOUNTER — Telehealth

## 2024-08-17 DIAGNOSIS — B999 Unspecified infectious disease: Secondary | ICD-10-CM | POA: Diagnosis not present

## 2024-08-17 MED ORDER — METRONIDAZOLE 500 MG PO TABS: 500.0000 mg | ORAL_TABLET | Freq: Two times a day (BID) | ORAL | 0 refills | Status: DC

## 2024-08-17 MED ORDER — CLINDAMYCIN PHOSPHATE 2 % VA CREA: TOPICAL_CREAM | VAGINAL | 0 refills | Status: DC

## 2024-08-17 NOTE — Progress Notes (Signed)
 Virtual Visit Consent   Charles Gibson, you are scheduled for a virtual visit with a [facility] provider today. Just as with appointments in the office, your consent must be obtained to participate. Your consent will be active for this visit and any virtual visit you may have with one of our providers in the next 365 days. If you have a MyChart account, a copy of this consent can be sent to you electronically.  As this is a virtual visit, video technology does not allow for your provider to perform a traditional examination. This may limit your provider's ability to fully assess your condition. If your provider identifies any concerns that need to be evaluated in person or the need to arrange testing (such as labs, EKG, etc.), we will make arrangements to do so. Although advances in technology are sophisticated, we cannot ensure that it will always work on either your end or our end. If the connection with a video visit is poor, the visit may have to be switched to a telephone visit. With either a video or telephone visit, we are not always able to ensure that we have a secure connection.  By engaging in this virtual visit, you consent to the provision of healthcare and authorize for your insurance to be billed (if applicable) for the services provided during this visit. Depending on your insurance coverage, you may receive a charge related to this service.  I need to obtain your verbal consent now. Are you willing to proceed with your visit today? Charles Gibson has provided verbal consent on 08/17/2024 for a virtual visit (video or telephone). Charles Gibson, [place]  Date: 08/17/2024 7:57 AM   Virtual Visit via Video Note   I, Charles Gibson, connected with  Charles Gibson  ([id number], Jun 06, 1976) on 08/17/24 at  7:45 AM EDT by a video-enabled telemedicine application and verified that I am speaking with the correct person using two identifiers.  Location: Patient: Virtual Visit Location  Patient: Mobile Provider: Virtual Visit Location Provider: Home Office   I discussed the limitations of evaluation and management by telemedicine and the availability of in person appointments. The patient expressed understanding and agreed to proceed.    History of Present Illness: Charles Gibson is a 48 y.o. who identifies as a male who was assigned male at birth, and is being seen today for questions about BV. Notes he is monogamous with one male partner. He recently had CPE with his PCP including a full STI panel which was unremarkable. Notes his partner has had issue recently with a recurrent string of BV infections despite treatment and is wondering if he could be playing a role in this. Denies penile pain, swelling or discharge or other noted symptoms.   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   Routine general medical examination at a health care facility 08/21/2014   Hyperlipidemia 08/21/2014   RUQ pain 02/19/2014   Renal cyst, right 02/19/2014   URI (upper respiratory infection) 02/19/2014    Allergies:  Allergies  Allergen Reactions   Adhesive [Tape] Rash   Latex Rash    Very mild reaction  Silicone Rash   Medications:  Current Outpatient Medications:    clindamycin  (CLEOCIN ) 2 % vaginal cream, To be applied topically to penile skin twice daily for 7 days -- for recurrent BV in partner, Disp: 35 g, Rfl: 0   metroNIDAZOLE  (FLAGYL ) 500 MG tablet, Take 1 tablet (500 mg total) by mouth 2 (two) times daily., Disp: 14 tablet, Rfl: 0   Azelastine HCl 137 MCG/SPRAY SOLN, Place 2 sprays into the nose., Disp: , Rfl:    azithromycin   (ZITHROMAX ) 250 MG tablet, 2 tabs on day 1, 1 tab on day 2-5, Disp: 6 tablet, Rfl: 0   fluticasone  (FLONASE ) 50 MCG/ACT nasal spray, Place 2 sprays into both nostrils daily., Disp: 16 g, Rfl: 6   levocetirizine (XYZAL) 5 MG tablet, Take by mouth., Disp: , Rfl:    tadalafil  (CIALIS ) 5 MG tablet, Take 1 tablet by mouth once daily, Disp: 30 tablet, Rfl: 0  Observations/Objective: Patient is well-developed, well-nourished in no acute distress.  Resting comfortably  at home.  Head is normocephalic, atraumatic.  No labored breathing.  Speech is clear and coherent with logical content.  Patient is alert and oriented at baseline.    Assessment and Plan: 1. Recurrent infections (Primary) - metroNIDAZOLE  (FLAGYL ) 500 MG tablet; Take 1 tablet (500 mg total) by mouth 2 (two) times daily.  Dispense: 14 tablet; Refill: 0 - clindamycin  (CLEOCIN ) 2 % vaginal cream; To be applied topically to penile skin twice daily for 7 days -- for recurrent BV in partner  Dispense: 35 g; Refill: 0  Partner with recurrent BV infections. Discussed potential causes of this including reinoculation of infection by male partner. Discussed a lot of research has been done on this recently with some updated guidelines for treatment of partners. Per UTD will start Metronidazole  BID x 7 days and Clindamycin  2% cream applied topically to penile skin BID x 7 days. Partner is also being treated for active BV now. They should refrain from any activity until both courses of treatment are completed.   Follow Up Instructions: I discussed the assessment and treatment plan with the patient. The patient was provided an opportunity to ask questions and all were answered. The patient agreed with the plan and demonstrated an understanding of the instructions.  A copy of instructions were sent to the patient via MyChart unless otherwise noted below.   The patient was advised to call back or seek an in-person evaluation if the symptoms worsen or if  the condition fails to improve as anticipated.    Charles Velma Lunger, PA-C

## 2024-08-17 NOTE — Patient Instructions (Signed)
  [name], thank you for joining [name], PA-C for today's virtual visit.  While this provider is not your primary care provider (PCP), if your PCP is located in our provider database this encounter information will be shared with them immediately following your visit.   A [facility] MyChart account gives you access to today's visit and all your visits, tests, and labs performed at [facility]  click here if you don't have a [facility] MyChart account or go to [email or web address]  Consent: (Patient) Charles Gibson provided verbal consent for this virtual visit at the beginning of the encounter.  Current Medications:  Current Outpatient Medications:    clindamycin  (CLEOCIN ) 2 % vaginal cream, To be applied topically to penile skin twice daily for 7 days -- for recurrent BV in partner, Disp: 35 g, Rfl: 0   metroNIDAZOLE  (FLAGYL ) 500 MG tablet, Take 1 tablet (500 mg total) by mouth 2 (two) times daily., Disp: 14 tablet, Rfl: 0   Azelastine HCl 137 MCG/SPRAY SOLN, Place 2 sprays into the nose., Disp: , Rfl:    azithromycin  (ZITHROMAX ) 250 MG tablet, 2 tabs on day 1, 1 tab on day 2-5, Disp: 6 tablet, Rfl: 0   fluticasone  (FLONASE ) 50 MCG/ACT nasal spray, Place 2 sprays into both nostrils daily., Disp: 16 g, Rfl: 6   levocetirizine (XYZAL) 5 MG tablet, Take by mouth., Disp: , Rfl:    tadalafil  (CIALIS ) 5 MG tablet, Take 1 tablet by mouth once daily, Disp: 30 tablet, Rfl: 0   Medications ordered in this encounter:  Meds ordered this encounter  Medications   metroNIDAZOLE  (FLAGYL ) 500 MG tablet    Sig: Take 1 tablet (500 mg total) by mouth 2 (two) times daily.    Dispense:  14 tablet    Refill:  0    Supervising Provider:   LAMPTEY, PHILIP O [[id number]]   clindamycin  (CLEOCIN ) 2 % vaginal cream    Sig: To be applied topically to penile skin twice daily for 7 days -- for recurrent BV in partner    Dispense:  35 g    Refill:  0    Supervising Provider:    BLAISE ALEENE KIDD [phone]     *If you need refills on other medications prior to your next appointment, please contact your pharmacy*  Follow-Up: Call back or seek an in-person evaluation if the symptoms worsen or if the condition fails to improve as anticipated.  [facility] [phone]  Other Instructions Start Metronidazole  twice daily x 7 days and Clindamycin  2% cream applied topically to penile skin twice daily x 7 days. You and your partner should refrain from any activity until both of your courses of treatment are completed.    If you have been instructed to have an in-person evaluation today at a local Urgent Care facility, please use the link below. It will take you to a list of all of our available [facility], including address, phone number and hours of operation. Please do not delay care.  [facility]  If you or a family member do not have a primary care provider, use the link below to schedule a visit and establish care. When you choose a [facility] primary care physician or advanced practice provider, you gain a long-term partner in health. Find a Primary Care Provider  Learn more about [facility]'s in-office and virtual care options: [facility] - Get Care Now

## 2024-09-17 ENCOUNTER — Other Ambulatory Visit: Payer: Self-pay | Admitting: Family Medicine

## 2024-10-24 ENCOUNTER — Encounter: Payer: Self-pay | Admitting: Family Medicine

## 2024-10-24 ENCOUNTER — Other Ambulatory Visit: Payer: Self-pay | Admitting: Family Medicine

## 2024-11-10 ENCOUNTER — Ambulatory Visit: Admitting: Family Medicine

## 2024-11-10 ENCOUNTER — Encounter: Payer: Self-pay | Admitting: Family Medicine

## 2024-11-10 VITALS — BP 126/80 | HR 64 | Temp 97.9°F | Resp 18 | Ht 71.0 in | Wt 190.4 lb

## 2024-11-10 DIAGNOSIS — Z0000 Encounter for general adult medical examination without abnormal findings: Secondary | ICD-10-CM | POA: Diagnosis not present

## 2024-11-10 DIAGNOSIS — Z23 Encounter for immunization: Secondary | ICD-10-CM | POA: Diagnosis not present

## 2024-11-10 DIAGNOSIS — Z125 Encounter for screening for malignant neoplasm of prostate: Secondary | ICD-10-CM | POA: Diagnosis not present

## 2024-11-10 DIAGNOSIS — Z1159 Encounter for screening for other viral diseases: Secondary | ICD-10-CM

## 2024-11-10 DIAGNOSIS — E785 Hyperlipidemia, unspecified: Secondary | ICD-10-CM

## 2024-11-10 LAB — HEPATIC FUNCTION PANEL
ALT: 25 U/L (ref 0–53)
AST: 26 U/L (ref 0–37)
Albumin: 4.6 g/dL (ref 3.5–5.2)
Alkaline Phosphatase: 53 U/L (ref 39–117)
Bilirubin, Direct: 0.1 mg/dL (ref 0.0–0.3)
Total Bilirubin: 0.7 mg/dL (ref 0.2–1.2)
Total Protein: 7.4 g/dL (ref 6.0–8.3)

## 2024-11-10 LAB — CBC WITH DIFFERENTIAL/PLATELET
Basophils Absolute: 0 K/uL (ref 0.0–0.1)
Basophils Relative: 0.7 % (ref 0.0–3.0)
Eosinophils Absolute: 0.1 K/uL (ref 0.0–0.7)
Eosinophils Relative: 2.9 % (ref 0.0–5.0)
HCT: 44.2 % (ref 39.0–52.0)
Hemoglobin: 14.7 g/dL (ref 13.0–17.0)
Lymphocytes Relative: 30.3 % (ref 12.0–46.0)
Lymphs Abs: 1.5 K/uL (ref 0.7–4.0)
MCHC: 33.2 g/dL (ref 30.0–36.0)
MCV: 87.5 fl (ref 78.0–100.0)
Monocytes Absolute: 0.5 K/uL (ref 0.1–1.0)
Monocytes Relative: 9.6 % (ref 3.0–12.0)
Neutro Abs: 2.8 K/uL (ref 1.4–7.7)
Neutrophils Relative %: 56.5 % (ref 43.0–77.0)
Platelets: 215 K/uL (ref 150.0–400.0)
RBC: 5.05 Mil/uL (ref 4.22–5.81)
RDW: 13 % (ref 11.5–15.5)
WBC: 5 K/uL (ref 4.0–10.5)

## 2024-11-10 LAB — BASIC METABOLIC PANEL WITH GFR
BUN: 10 mg/dL (ref 6–23)
CO2: 31 meq/L (ref 19–32)
Calcium: 9.3 mg/dL (ref 8.4–10.5)
Chloride: 101 meq/L (ref 96–112)
Creatinine, Ser: 1.08 mg/dL (ref 0.40–1.50)
GFR: 80.08 mL/min (ref >60.00)
Glucose, Bld: 85 mg/dL (ref 70–99)
Potassium: 4.3 meq/L (ref 3.5–5.1)
Sodium: 140 meq/L (ref 135–145)

## 2024-11-10 LAB — LIPID PANEL
Cholesterol: 243 mg/dL — ABNORMAL HIGH (ref 0–200)
HDL: 49.6 mg/dL (ref >39.00)
LDL Cholesterol: 174 mg/dL — ABNORMAL HIGH (ref 0–99)
NonHDL: 193.17
Total CHOL/HDL Ratio: 5
Triglycerides: 95 mg/dL (ref 0.0–149.0)
VLDL: 19 mg/dL (ref 0.0–40.0)

## 2024-11-10 LAB — PSA: PSA: 1.98 ng/mL (ref 0.10–4.00)

## 2024-11-10 LAB — TSH: TSH: 1.74 u[IU]/mL (ref 0.35–5.50)

## 2024-11-10 NOTE — Progress Notes (Signed)
   Subjective:    Patient ID: Charles Gibson, male    DOB: Feb 13, 1976, 48 y.o.   MRN: [id number]  HPI CPE- UTD on Tdap.  Due for colonoscopy (plans to schedule), flu.  Patient Care Team    Relationship Specialty Notifications Start End  Mahlon Comer BRAVO, MD PCP - General Family Medicine  02/19/14   Alline Lenis, MD (Inactive) Consulting Physician Urology  08/21/14     Health Maintenance  Topic Date Due   Hepatitis C Screening  Never done   Hepatitis B Vaccines 19-59 Average Risk (1 of 3 - 19+ 3-dose series) Never done   Colonoscopy  Never done   Pneumococcal Vaccine: 50+ Years (1 of 1 - PCV) Never done   Zoster Vaccines- Shingrix (1 of 2) Never done   Influenza Vaccine  07/28/2024   COVID-19 Vaccine (1 - 2025-26 season) Never done   DTaP/Tdap/Td (2 - Td or Tdap) 10/09/2026   HIV Screening  Completed   HPV VACCINES  Aged Out   Meningococcal B Vaccine  Aged Out      Review of Systems Patient reports no vision/hearing changes, anorexia, fever ,adenopathy, persistant/recurrent hoarseness, swallowing issues, chest pain, palpitations, edema, persistant/recurrent cough, hemoptysis, dyspnea (rest,exertional, paroxysmal nocturnal), gastrointestinal  bleeding (melena, rectal bleeding), abdominal pain, excessive heart burn, GU symptoms (dysuria, hematuria, voiding/incontinence issues) syncope, focal weakness, memory loss, numbness & tingling, skin/hair/nail changes, depression, anxiety, abnormal bruising/bleeding, musculoskeletal symptoms/signs.     Objective:   Physical Exam General Appearance:    Alert, cooperative, no distress, appears stated age  Head:    Normocephalic, without obvious abnormality, atraumatic  Eyes:    PERRL, conjunctiva/corneas clear, EOM's intact both eyes       Ears:    Normal TM's and external ear canals, both ears  Nose:   Nares normal, septum midline, mucosa normal, no drainage   or sinus tenderness  Throat:   Lips, mucosa, and tongue normal; teeth and gums  normal  Neck:   Supple, symmetrical, trachea midline, no adenopathy;       thyroid :  No enlargement/tenderness/nodules  Back:     Symmetric, no curvature, ROM normal, no CVA tenderness  Lungs:     Clear to auscultation bilaterally, respirations unlabored  Chest wall:    No tenderness or deformity  Heart:    Regular rate and rhythm, S1 and S2 normal, no murmur, rub   or gallop  Abdomen:     Soft, non-tender, bowel sounds active all four quadrants,    no masses, no organomegaly  Genitalia:    deferred  Rectal:    Extremities:   Extremities normal, atraumatic, no cyanosis or edema  Pulses:   2+ and symmetric all extremities  Skin:   Skin color, texture, turgor normal, no rashes or lesions  Lymph nodes:   Cervical, supraclavicular, and axillary nodes normal  Neurologic:   CNII-XII intact. Normal strength, sensation and reflexes      throughout          Assessment & Plan:

## 2024-11-10 NOTE — Patient Instructions (Addendum)
 Follow up in 1 year or as needed We'll notify you of your lab results and make any changes if needed Continue to work on healthy diet and regular exercise- you look great! SCHEDULE your colonoscopy at your convenience [phone] Call with any questions or concerns Stay Safe!  Stay Healthy! Happy Holidays!!

## 2024-11-11 LAB — HEPATITIS C ANTIBODY: Hepatitis C Ab: NONREACTIVE

## 2024-11-12 NOTE — Assessment & Plan Note (Signed)
 Pt's PE WNL.  UTD on Tdap.  Flu shot given today.  Plans to schedule colonoscopy.  Check labs.  Anticipatory guidance provided.

## 2024-11-13 ENCOUNTER — Ambulatory Visit: Payer: Self-pay | Admitting: Family Medicine

## 2025-01-18 ENCOUNTER — Encounter: Admitting: Family Medicine

## 2025-01-18 ENCOUNTER — Ambulatory Visit (INDEPENDENT_AMBULATORY_CARE_PROVIDER_SITE_OTHER): Admitting: Family Medicine

## 2025-01-18 ENCOUNTER — Encounter: Payer: Self-pay | Admitting: Family Medicine

## 2025-01-18 VITALS — BP 128/70 | HR 60 | Ht 71.0 in | Wt 188.8 lb

## 2025-01-18 DIAGNOSIS — N489 Disorder of penis, unspecified: Secondary | ICD-10-CM | POA: Diagnosis not present

## 2025-01-18 MED ORDER — CLOTRIMAZOLE-BETAMETHASONE 1-0.05 % EX CREA: 1.0000 | TOPICAL_CREAM | Freq: Every day | CUTANEOUS | 3 refills | Status: AC

## 2025-01-18 NOTE — Progress Notes (Signed)
" ° °  Subjective:    Patient ID: Charles Gibson, male    DOB: [birth year], 49 y.o.   MRN: [id number]  HPI Groin rash- pt reports 3-4 weeks ago had 2 white patches at base of penis.  Prior to that had 'jock itch' on inner thighs.  Area is described as white patches that are irritated, circular.  Some itching and burning.  Has used Gold [place], Aloe w/o relief.   Review of Systems For ROS see HPI     Objective:   Physical Exam Vitals reviewed.  Constitutional:      Appearance: Normal appearance. He is normal weight. He is not ill-appearing.  HENT:     Head: Normocephalic and atraumatic.  Skin:    General: Skin is warm and dry.     Findings: Lesion (2 small (~0.75 cm) circular lesions at base of penis, no ulceration or TTP) present.  Neurological:     General: No focal deficit present.     Mental Status: He is alert and oriented to person, place, and time.  Psychiatric:        Mood and Affect: Mood normal.        Behavior: Behavior normal.        Thought Content: Thought content normal.           Assessment & Plan:  Penile lesions- new.  Well circumscribed lesions at base of penis.  No TTP or ulceration.  Suspect tinea cruris but will check RPR and HSV.  Pt reports he has been negative for both.  Start Lotrisone  cream BID.  Pt expressed understanding and is in agreement w/ plan.   "

## 2025-01-18 NOTE — Patient Instructions (Addendum)
 Follow up as needed or as scheduled We'll notify you of your lab results and make any changes if needed USE the Lotrisone  cream twice daily Call with any questions or concerns Stay Safe!  Stay Healthy!

## 2025-01-19 LAB — SYPHILIS: RPR W/REFLEX TO RPR TITER AND TREPONEMAL ANTIBODIES, TRADITIONAL SCREENING AND DIAGNOSIS ALGORITHM: RPR Ser Ql: NONREACTIVE

## 2025-01-23 ENCOUNTER — Ambulatory Visit: Payer: Self-pay | Admitting: Family Medicine

## 2025-11-13 ENCOUNTER — Encounter: Admitting: Family Medicine
